# Patient Record
Sex: Female | Born: 1960
Health system: Southern US, Community
[De-identification: ages and names within clinical notes are randomized; demographics above are authoritative.]

## PROBLEM LIST (undated history)

## (undated) DIAGNOSIS — I10 Essential (primary) hypertension: Secondary | ICD-10-CM

## (undated) DIAGNOSIS — T7840XA Allergy, unspecified, initial encounter: Secondary | ICD-10-CM

## (undated) DIAGNOSIS — K635 Polyp of colon: Secondary | ICD-10-CM

## (undated) DIAGNOSIS — E7800 Pure hypercholesterolemia, unspecified: Secondary | ICD-10-CM

## (undated) DIAGNOSIS — M810 Age-related osteoporosis without current pathological fracture: Secondary | ICD-10-CM

## (undated) DIAGNOSIS — Z8619 Personal history of other infectious and parasitic diseases: Secondary | ICD-10-CM

## (undated) DIAGNOSIS — K219 Gastro-esophageal reflux disease without esophagitis: Secondary | ICD-10-CM

## (undated) HISTORY — DX: Personal history of other infectious and parasitic diseases: Z86.19

## (undated) HISTORY — DX: Age-related osteoporosis without current pathological fracture: M81.0

## (undated) HISTORY — DX: Allergy, unspecified, initial encounter: T78.40XA

## (undated) HISTORY — PX: TONSILLECTOMY: SUR1361

## (undated) HISTORY — DX: Essential (primary) hypertension: I10

## (undated) HISTORY — DX: Gastro-esophageal reflux disease without esophagitis: K21.9

## (undated) HISTORY — DX: Pure hypercholesterolemia, unspecified: E78.00

## (undated) HISTORY — PX: COLONOSCOPY: SHX174

## (undated) HISTORY — DX: Polyp of colon: K63.5

---

## 1998-09-16 ENCOUNTER — Other Ambulatory Visit: Admission: RE | Admit: 1998-09-16 | Discharge: 1998-09-16 | Payer: Self-pay | Admitting: Obstetrics and Gynecology

## 1999-11-14 ENCOUNTER — Other Ambulatory Visit: Admission: RE | Admit: 1999-11-14 | Discharge: 1999-11-14 | Payer: Self-pay | Admitting: Obstetrics and Gynecology

## 2000-02-21 ENCOUNTER — Encounter: Payer: Self-pay | Admitting: *Deleted

## 2000-02-21 ENCOUNTER — Ambulatory Visit (HOSPITAL_COMMUNITY): Admission: RE | Admit: 2000-02-21 | Discharge: 2000-02-21 | Payer: Self-pay | Admitting: *Deleted

## 2000-05-01 ENCOUNTER — Encounter: Admission: RE | Admit: 2000-05-01 | Discharge: 2000-07-30 | Payer: Self-pay | Admitting: Anesthesiology

## 2000-11-09 ENCOUNTER — Encounter: Admission: RE | Admit: 2000-11-09 | Discharge: 2001-02-07 | Payer: Self-pay | Admitting: Anesthesiology

## 2002-04-17 ENCOUNTER — Other Ambulatory Visit: Admission: RE | Admit: 2002-04-17 | Discharge: 2002-04-17 | Payer: Self-pay | Admitting: Obstetrics and Gynecology

## 2003-05-06 ENCOUNTER — Other Ambulatory Visit: Admission: RE | Admit: 2003-05-06 | Discharge: 2003-05-06 | Payer: Self-pay | Admitting: Obstetrics and Gynecology

## 2004-07-26 ENCOUNTER — Other Ambulatory Visit: Admission: RE | Admit: 2004-07-26 | Discharge: 2004-07-26 | Payer: Self-pay | Admitting: Obstetrics and Gynecology

## 2005-09-05 ENCOUNTER — Other Ambulatory Visit: Admission: RE | Admit: 2005-09-05 | Discharge: 2005-09-05 | Payer: Self-pay | Admitting: Obstetrics and Gynecology

## 2010-11-25 NOTE — H&P (Signed)
[facility]  Patient:    Marissa Gross, Marissa Gross                       MRN: [id number] Adm. Date:  [id number] Attending:  Thyra Breed CC:         Lilyan Punt. Sydnee Levans, M.D.   History and Physical  FOLLOWUP EVALUATION  HISTORY OF PRESENT ILLNESS:  Marissa Gross comes in for follow-up evaluation of her chronic low back pain syndrome on the basis of degenerative disc disease. Since her last evaluation, the patient has done remarkably well increasing her level of activity up to one to two hours four to five times per week.  She did note when she went to weightlifting she had more problems with her back, but she has reduced this back and is having less problems.  She has minimal complaints today.  She has no new neurologic symptoms.  She is planning to get into Pilates in the very near future.  CURRENT MEDICATIONS:  None.  PHYSICAL EXAMINATION:  VITAL SIGNS:  Blood pressure 115/54, heart rate 50, respiratory rate 16, and O2 saturation 100%.  Pain level 0/10.  NEUROLOGIC:  The patient demonstrated symmetric deep tendon reflexes with negative straight leg raise signs.  Motor was 5/5.  The patients pain was minimally exacerbated by hyperextension to 20 degrees and forward flexion to 45 degrees.  IMPRESSION:  Low back pain syndrome which seems to be fairly stable on current reconditioning management.  DISPOSITION: 1. Continue with current level of exercise and try to increase this with    Pilates as tolerated. 2. Followup with me in about year if needed. DD:  11/13/00 TD:  11/13/00 Job: [id number]

## 2010-11-25 NOTE — H&P (Signed)
[facility]  Patient:    Marissa Gross                       MRN: [id number] Proc. Date: 05/11/00 Adm. Date:  [id number] Attending:  Thyra Breed CC:         Lilyan Punt. Sydnee Levans, M.D.   History and Physical  HISTORY OF PRESENT ILLNESS:  Marissa Gross is a very pleasant 50 year old who is sent to [place] by Dr. Caryn Bee C. [place] for evaluation of her low back pain on the basis of degenerative disk disease.  The patient said that she was in her usual state of health up until July, when she was working out and took a fall on her knees and developed increasing lower back discomfort.  It took about two weeks for this to increase in severity.  She was seen by Dr. Tonny Branch and treated with nonsteroidal anti-inflammatory agents and physical therapy.  The medications were of no benefit but the physical therapy did help.  She underwent an MRI of her back which showed mild circumferential disk bulge at 4-5 and slight disk bulge at 5-S1.  She was sent to Dr. Julio Sicks, who did not feel she has a surgically amenable back.  She has continued with physical therapy and is going into Pilades-type exercises which sound like they may be helpful.  She is trying to stress more stretching exercises.  She describes her discomfort as a dull ache in her lower back which is made worse by bending and improved by lying down. There is no numbness or tingling, weakness or bowel or bladder incontinence. She feels as though her muscles are constantly strained.  CURRENT MEDICATIONS:  None.  ALLERGIES:  None.  FAMILY HISTORY:  Family history is positive for insulin-dependent diabetes in her brother, hypothyroidism, hypercholesterolemia and osteoarthritis.  PAST SURGICAL HISTORY:  Significant for a tonsillectomy.  SOCIAL HISTORY:  The patient is a nonsmoker.  She rarely drinks alcohol.  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  ACTIVE MEDICAL  PROBLEMS:  None.  REVIEW OF SYSTEMS:  Negative for general, head, eyes, nose, mouth, throat, ears, pulmonary, cardiovascular, GI, GU, neurologic, hematologic, endocrine, psychiatric.  Allergies positive for dry skin and positive family history of dry skin, as well as her musculoskeletal problems; she also has had some knee discomfort.  EXAMINATION  VITAL SIGNS:  Blood pressure 110/54, heart rate is 55, respiratory rate is 10, O2 is 100%, pain level is 2/10 and temperature is 97.7.  GENERAL:  This is a pleasant female in no acute distress.  HEENT:  Head was normocephalic, atraumatic.  Eyes:  Extraocular movements intact with conjunctivae and sclerae clear.  Nose:  Patent nares without discharge.  Oropharynx is free of lesions.  NECK:  Supple without lymphadenopathy.  Carotids are 2+ and symmetric without bruits.  LUNGS:  Clear.  HEART:  Regular rate and rhythm.  BREASTS:  Not performed.  ABDOMEN:  Not performed.  PELVIC:  Not performed.  RECTAL:  Not performed.  BACK:  Exam revealed mild increased pain on hyperextension, which was more brought out by palpation along the right side of the lumbar facets and was mild, even to palpation.  Forward flexion to about 60 degrees brought on some mild discomfort.  Straight leg raise signs are negative.  Gait is intact.  EXTREMITIES:  No cyanosis, clubbing nor edema, with radial pulses and dorsalis pedis pulses 2+ and symmetric.  NEUROLOGIC:  Patient is oriented x 4.  Cranial nerves II-XII are grossly intact.  Deep tendon reflexes were 2+ and symmetric in the upper and lower extremities with downgoing toes.  Motor was 5/5 with symmetric bulk and tone. Sensory was intact to scratch sense and light touch.  Coordination was grossly intact.  IMPRESSION:  Chronic low back pain on the basis of degenerative disk disease in an otherwise healthy female.  DISPOSITION 1. The patient was given some suggestions to read ______ book on the  back to    see whether this could be of some benefit. 2. She was encouraged to proceed with her exercise regime in a conservative    manner. 3. Trial of apple cider vinegar and honey to see whether this helps. 4. Follow up with me in six months to see whether there is any progression in    her problems.  She is encouraged to call if she is not responding to this    course of management. DD:  05/11/00 TD:  05/11/00 Job: [id number]

## 2011-08-07 LAB — HM COLONOSCOPY

## 2016-10-31 LAB — HM PAP SMEAR

## 2016-11-01 LAB — HM MAMMOGRAPHY

## 2017-07-10 HISTORY — PX: MOUTH SURGERY: SHX715

## 2017-12-27 LAB — HM PAP SMEAR

## 2017-12-31 LAB — HM MAMMOGRAPHY

## 2018-01-01 LAB — LIPID PANEL
Cholesterol: 206 — AB (ref 0–200)
HDL: 89 — AB (ref 35–70)
LDL Cholesterol: 96
Triglycerides: 111 (ref 40–160)

## 2018-01-04 LAB — VITAMIN D 25 HYDROXY (VIT D DEFICIENCY, FRACTURES): Vit D, 25-Hydroxy: 39

## 2018-01-04 LAB — TSH: TSH: 1.77 (ref <=5.90)

## 2018-01-04 LAB — HEMOGLOBIN A1C: Hemoglobin A1C: 5.1

## 2018-05-02 ENCOUNTER — Encounter: Payer: Self-pay | Admitting: Family Medicine

## 2018-05-02 ENCOUNTER — Ambulatory Visit (INDEPENDENT_AMBULATORY_CARE_PROVIDER_SITE_OTHER): Payer: Commercial Managed Care - PPO | Admitting: Family Medicine

## 2018-05-02 VITALS — BP 172/98 | HR 56 | Temp 97.6°F | Ht 64.0 in | Wt 119.2 lb

## 2018-05-02 DIAGNOSIS — Z0000 Encounter for general adult medical examination without abnormal findings: Secondary | ICD-10-CM | POA: Diagnosis not present

## 2018-05-02 DIAGNOSIS — R140 Abdominal distension (gaseous): Secondary | ICD-10-CM

## 2018-05-02 DIAGNOSIS — R030 Elevated blood-pressure reading, without diagnosis of hypertension: Secondary | ICD-10-CM | POA: Diagnosis not present

## 2018-05-02 DIAGNOSIS — H6123 Impacted cerumen, bilateral: Secondary | ICD-10-CM | POA: Diagnosis not present

## 2018-05-02 NOTE — Patient Instructions (Signed)
Debrox is over the counter ear wax medicine you can use  Once I get your records will let you know about labs.   Check Bp at home. Would come back in one month for recheck.. If still high we will need to start meds... :(

## 2018-05-02 NOTE — Progress Notes (Signed)
Patient: Marissa Gross MRN: [id number] DOB: 1961-01-05 PCP: Orland Mustard, MD     Subjective:  Chief Complaint  Patient presents with  . Establish Care    HPI: The patient is a 57 y.o. female who presents today for annual exam. She denies any changes to past medical history. There have been no recent hospitalizations. They are following a well balanced diet and exercise plan. Weight has been stable. Mom had breast cancer. She is up to date on her mmg.   She feels like she has gotten more gassy in the past year. Sometimes she will have pain, but not very often. She has intermittent bloating that comes and goes. Pain is more in her lower quadrants when it comes and doesn't last that long. Sometimes relieved with a BM and sometimes it goes away. There is no consistency and she thinks more due to a certain type of food. No diarrhea/constipation. No blood in stool. She still has her uterus and ovaries. No otc medications have been tried.   She also thinks she has cerumen in her ears that needs cleaned out.   There is no immunization history on file for this patient.   Colonoscopy: has had this and is past due.  Mammogram: June 2019. Normal  Pap smear: June 2019 Tdap: today  Flu shot: already done this year     Review of Systems  Constitutional: Positive for fatigue. Negative for chills and fever.  HENT: Negative for dental problem, ear pain, hearing loss and trouble swallowing.   Eyes: Negative for visual disturbance.  Respiratory: Negative for cough, chest tightness and shortness of breath.   Cardiovascular: Negative for chest pain, palpitations and leg swelling.  Gastrointestinal: Positive for abdominal pain. Negative for blood in stool, constipation, diarrhea and nausea.       Pt w/lower centralized abdominal pain occasionally.  More gassy than usual recently  Endocrine: Negative for cold intolerance, polydipsia, polyphagia and polyuria.  Genitourinary: Negative for dysuria and  hematuria.  Musculoskeletal: Negative for arthralgias, back pain and neck pain.  Skin: Negative for rash.  Neurological: Negative for dizziness and headaches.  Psychiatric/Behavioral: Positive for sleep disturbance. Negative for dysphoric mood. The patient is not nervous/anxious.     Allergies Patient has No Known Allergies.  Past Medical History Patient  has a past medical history of Allergy, Colon polyps, GERD (gastroesophageal reflux disease), and History of chicken pox.  Surgical History Patient  has a past surgical history that includes Tonsillectomy.  Family History Pateint's family history includes Arthritis in her father and mother; Birth defects in her brother; Cancer in her father and mother; Diabetes in her brother; Heart attack in her brother; Hyperlipidemia in her brother, father, and mother; Hypertension in her father.  Social History Patient  reports that she has quit smoking. Her smoking use included cigarettes. She has never used smokeless tobacco. She reports that she drinks alcohol. She reports that she does not use drugs.    Objective: Vitals:   05/02/18 1000 05/02/18 1059 05/02/18 1420  BP: (!) 142/80 (!) 180/100 (!) 172/98  Pulse: (!) 56    Temp: 97.6 F (36.4 C)    TempSrc: Oral    SpO2: 100%    Weight: 119 lb 3.2 oz (54.1 kg)    Height: 5\' 4"  (1.626 m)      Body mass index is 20.46 kg/m.  Physical Exam  Constitutional: She is oriented to person, place, and time. She appears well-developed and well-nourished.  HENT:  Right Ear:  External ear normal.  Left Ear: External ear normal.  Mouth/Throat: Oropharynx is clear and moist.  Bilateral TM obscured by impacted cerumen. After ears washed out, TM visualized and normal bilaterally   Eyes: Pupils are equal, round, and reactive to light. Conjunctivae and EOM are normal.  Neck: Normal range of motion. Neck supple. No thyromegaly present.  Cardiovascular: Normal rate, regular rhythm, normal heart sounds  and intact distal pulses.  No murmur heard. Pulmonary/Chest: Effort normal and breath sounds normal.  Abdominal: Soft. Bowel sounds are normal. She exhibits no distension. There is no tenderness.  Lymphadenopathy:    She has no cervical adenopathy.  Neurological: She is alert and oriented to person, place, and time. She displays normal reflexes. No cranial nerve deficit. Coordination normal.  Skin: Skin is warm and dry. No rash noted.  Psychiatric: She has a normal mood and affect. Her behavior is normal.  Vitals reviewed.    Ceruminosis is noted.  Wax is removed by syringing and manual debridement. Instructions for home care to prevent wax buildup are given.   Depression screen PHQ 2/9 05/02/2018  Decreased Interest 0  Down, Depressed, Hopeless 0  PHQ - 2 Score 0       Assessment/plan: 1. Annual physical exam utd on most of her health maintenance. Needs tdap and left before I could give this. Will give at follow up. Requesting all of her records before we do labs as she states her gyn ran a lot of tests this summer. Will still need hep c screen if we dray labs. She is a runner and eats healthy. Overall doing great. Also due for cscope which she will call and set up.  Patient counseling [x]    Nutrition: Stressed importance of moderation in sodium/caffeine intake, saturated fat and cholesterol, caloric balance, sufficient intake of fresh fruits, vegetables, fiber, calcium, iron, and 1 mg of folate supplement per day (for females capable of pregnancy).  [x]    Stressed the importance of regular exercise.   []    Substance Abuse: Discussed cessation/primary prevention of tobacco, alcohol, or other drug use; driving or other dangerous activities under the influence; availability of treatment for abuse.   [x]    Injury prevention: Discussed safety belts, safety helmets, smoke detector, smoking near bedding or upholstery.   [x]    Sexuality: Discussed sexually transmitted diseases, partner  selection, use of condoms, avoidance of unintended pregnancy  and contraceptive alternatives.  [x]    Dental health: Discussed importance of regular tooth brushing, flossing, and dental visits.  [x]    Health maintenance and immunizations reviewed. Please refer to Health maintenance section.      2. Elevated blood-pressure reading without diagnosis of hypertension Multiple elevated readings. She has a cuff at home and will start a log for me. Will come back in one month for follow up and recheck. Asked her to bring in her cuff. If still elevated, which I suspect it will be, will need to start medication. Discussed do not want her running this high and risks of uncontrolled HTN.    3. Bloating -intermittent and resolves and main complaint is gas. Likely associated with foods. FODMOP diet given. She will try to see if she can pinpoint what food could be contributing. Discussed if worsening symptoms or persistent bloating would work up ovarian issues, but at this point it resolves and is intermittent. She will try diet changes and let me know.    Return in about 1 month (around 06/02/2018) for bp check .   Orland Mustard,  MD [place] [facility]  05/02/2018

## 2018-06-10 ENCOUNTER — Encounter: Payer: Self-pay | Admitting: Family Medicine

## 2018-06-10 ENCOUNTER — Ambulatory Visit (INDEPENDENT_AMBULATORY_CARE_PROVIDER_SITE_OTHER): Payer: Commercial Managed Care - PPO | Admitting: Family Medicine

## 2018-06-10 VITALS — BP 140/64 | HR 46 | Temp 98.0°F | Ht 64.0 in | Wt 118.0 lb

## 2018-06-10 DIAGNOSIS — R030 Elevated blood-pressure reading, without diagnosis of hypertension: Secondary | ICD-10-CM

## 2018-06-10 DIAGNOSIS — Z23 Encounter for immunization: Secondary | ICD-10-CM

## 2018-06-10 NOTE — Progress Notes (Signed)
Patient: Marissa HeinzLori H Wittler MRN: [id number] DOB: Apr 29, 1961 PCP: Orland MustardWolfe, Julisa Flippo, MD     Subjective:  Chief Complaint  Patient presents with  . Hypertension    follow up    HPI: The patient is a 57 y.o. female who presents today for follow up of elevated blood pressure without diagnosis of HTN. I saw her a month ago and she had multiple elevated readings that were quite high and she was extremely surprised at this finding. We gave her a month to keep a home log of her readings and bring in her cuff today to make sure calibrated correctly. Home logs show her blood pressure to be all to goal. Ranges from 101-130/60-70. She did not bring in her blood pressure cuff today as she forgot. Denies any headaches, shortness of breath, chest pain or vision changes. Overall feels great.   Review of Systems  Constitutional: Negative for fatigue.  Respiratory: Negative for shortness of breath.   Cardiovascular: Negative for chest pain.  Gastrointestinal: Negative for abdominal pain, constipation, diarrhea and nausea.  Neurological: Negative for dizziness and headaches.  Psychiatric/Behavioral: Negative for sleep disturbance.    Allergies Patient has No Known Allergies.  Past Medical History Patient  has a past medical history of Allergy, Colon polyps, GERD (gastroesophageal reflux disease), and History of chicken pox.  Surgical History Patient  has a past surgical history that includes Tonsillectomy.  Family History Pateint's family history includes Arthritis in her father and mother; Birth defects in her brother; Cancer in her father and mother; Diabetes in her brother; Heart attack in her brother; Hyperlipidemia in her brother, father, and mother; Hypertension in her father.  Social History Patient  reports that she has quit smoking. Her smoking use included cigarettes. She has never used smokeless tobacco. She reports that she drinks alcohol. She reports that she does not use drugs.     Objective: Vitals:   06/10/18 0827 06/10/18 0839  BP: 122/82 140/64  Pulse: (!) 46   Temp: 98 F (36.7 C)   TempSrc: Oral   SpO2: 99%   Weight: 118 lb (53.5 kg)   Height: 5\' 4"  (1.626 m)     Body mass index is 20.25 kg/m.  Physical Exam  Constitutional: She appears well-developed and well-nourished.  Neck: Normal range of motion. Neck supple.  Cardiovascular: Normal rate, regular rhythm and normal heart sounds.  Pulmonary/Chest: Effort normal and breath sounds normal.  Abdominal: Soft. Bowel sounds are normal.  Psychiatric: She has a normal mood and affect. Her behavior is normal.  Vitals reviewed.      Assessment/plan: 1. Elevated blood-pressure reading without diagnosis of hypertension Home readings all to goal. Today much improved. When I repeated, her systolic was still elevated. She will continue home log and [facility] at the [facility] as well. Discussed goal of 130/80 or less. Will let me know if starts to run above this consistently. F/u for normal visit or as needed for blood pressure.   2. Need for Tdap vaccination  - Tdap vaccine greater than or equal to 7yo IM    Return if symptoms worsen or fail to improve.     Orland MustardAllison Baylor Cortez, MD [place] [facility]  06/10/2018

## 2018-09-09 LAB — HM COLONOSCOPY

## 2019-01-27 HISTORY — PX: COLONOSCOPY: SHX174

## 2019-01-27 LAB — HM COLONOSCOPY

## 2020-02-27 ENCOUNTER — Other Ambulatory Visit: Payer: Self-pay

## 2020-02-27 ENCOUNTER — Encounter: Payer: Self-pay | Admitting: Family Medicine

## 2020-02-27 ENCOUNTER — Ambulatory Visit (INDEPENDENT_AMBULATORY_CARE_PROVIDER_SITE_OTHER): Payer: Commercial Managed Care - PPO | Admitting: Family Medicine

## 2020-02-27 VITALS — BP 181/74 | HR 74 | Temp 98.4°F | Ht 64.0 in | Wt 120.4 lb

## 2020-02-27 DIAGNOSIS — R0602 Shortness of breath: Secondary | ICD-10-CM

## 2020-02-27 DIAGNOSIS — I10 Essential (primary) hypertension: Secondary | ICD-10-CM

## 2020-02-27 MED ORDER — LISINOPRIL 10 MG PO TABS: 10.0000 mg | ORAL_TABLET | Freq: Every day | ORAL | 3 refills | Status: DC

## 2020-02-27 NOTE — Progress Notes (Signed)
Patient: Marissa Gross MRN: [id number] DOB: 08/13/1960 PCP: Orland Mustard, MD     Subjective:  Chief Complaint  Patient presents with  . Hypertension  . Headache    HPI: The patient is a 59 y.o. female who presents today for elevated blood pressures. She hasn't been feeling good. She says that she hasn't been sleeping well. And short of breath on and off, she notices during her workouts. More noticeable now with masks wearing. She has headaches more often, that are very dull. She was seen in 04/2018 with a very elevated pressure. When she returned she had a more normal readings and was told to keep an eye on it, but has not followed up with it. She has had a lot going on for the past 2 weeks. Her mom passed away and she has moved twice. She doesn't think her home cuff is working correctly as it will be a normal reading than a high reading. She denies any palpitations/sweating. Thinks she may have shortness of breath when blood pressure is elevated. Feels short of breath with exercise. No cough/leg swelling. No vision changes.    Review of Systems  Constitutional: Negative for chills and fever.  Respiratory: Positive for shortness of breath. Negative for chest tightness and wheezing.   Cardiovascular: Negative for chest pain, palpitations and leg swelling.  Gastrointestinal: Negative for abdominal pain, nausea and vomiting.  Genitourinary: Negative for frequency.  Neurological: Negative for dizziness and headaches.    Allergies Patient has No Known Allergies.  Past Medical History Patient  has a past medical history of Allergy, Colon polyps, GERD (gastroesophageal reflux disease), and History of chicken pox.  Surgical History Patient  has a past surgical history that includes Tonsillectomy.  Family History Pateint's family history includes Arthritis in her father and mother; Birth defects in her brother; Cancer in her father and mother; Diabetes in her brother; Heart attack in  her brother; Hyperlipidemia in her brother, father, and mother; Hypertension in her father.  Social History Patient  reports that she has quit smoking. Her smoking use included cigarettes. She has never used smokeless tobacco. She reports current alcohol use. She reports that she does not use drugs.    Objective: Vitals:   02/27/20 1435  BP: (!) 181/74  Pulse: 74  Temp: 98.4 F (36.9 C)  TempSrc: Temporal  SpO2: 99%  Weight: 120 lb 6.4 oz (54.6 kg)  Height: 5\' 4"  (1.626 m)    Body mass index is 20.67 kg/m.  Physical Exam Vitals reviewed.  Constitutional:      Appearance: She is well-developed and normal weight.  HENT:     Head: Normocephalic and atraumatic.  Cardiovascular:     Rate and Rhythm: Regular rhythm. Bradycardia present.     Heart sounds: Normal heart sounds.  Pulmonary:     Effort: Pulmonary effort is normal.     Breath sounds: Normal breath sounds.  Abdominal:     General: Bowel sounds are normal.     Palpations: Abdomen is soft.  Musculoskeletal:     Cervical back: Normal range of motion and neck supple.  Skin:    General: Skin is warm and dry.  Neurological:     Mental Status: She is alert.  Psychiatric:        Mood and Affect: Mood normal.    ekg with LVH. Rate of 54. No st wave elevation. Some artifact.     Assessment/plan: 1. Essential hypertension Above goal x 2 readings and in the past.  We are going to start low dose lisinopril 10mg /day  and have her keep a log for me. After 2 weeks if still above 140/80 she is to increase medication to 20mg /day. Side effects of ACE-I discussed including dry cough and angioedema. She is to call me if feels like they have a dry cough and they are to call 911 or go to ER if any signs/symptoms of angioedema.  Since having periodic episodes of high blood pressure will check 24 hour urine metanephrines and catecholamines to rule out pheo. Her father does have HTN. Precautions given for ER if chest pain, worsening sob or  vision changes/headache. F/u with me in one month.   - EKG 12-Lead - Metanephrines, Urine, 24 hour; Future - Catecholamines, fractionated, Urine, 24 hour; Future - CBC with Differential/Platelet; Future - Comprehensive metabolic panel; Future - TSH; Future - Microalbumin / creatinine urine ratio; Future - Brain natriuretic peptide; Future - Microalbumin / creatinine urine ratio - TSH - Comprehensive metabolic panel - CBC with Differential/Platelet - Brain natriuretic peptide  2. Shortness of breath Exam wnl. No cough/swelling/orthopnea. Checking labs/BNP. If not better in 2 weeks we will xray.    This visit occurred during the SARS-CoV-2 public health emergency.  Safety protocols were in place, including screening questions prior to the visit, additional usage of staff PPE, and extensive cleaning of exam room while observing appropriate contact time as indicated for disinfecting solutions.     Return in about 1 month (around 03/29/2020) for blood pressure .   , MD [place] [facility]   02/27/2020

## 2020-02-27 NOTE — Patient Instructions (Signed)
-  ekg shows possible long standing HTN and strain on the heart -going to do urine labs to rule out pheochromocytoma. (can cause episodic spikes in blood pressure)  -starting lisinopril. Side effects of ACE-I discussed including dry cough and angioedema. Will start low dose at 10mg /day. If after 2 weeks you are still elevated increase to 20mg /day. Goal is <140/80. Medication takes about 2 weeks.   -if still feeling short of breath in 2 weeks email me so I can order CXR or if gets worse before this let me know.   -see you back in one month!  Dr. 

## 2020-02-28 LAB — CBC WITH DIFFERENTIAL/PLATELET
Absolute Monocytes: 413 cells/uL (ref 200–950)
Basophils Absolute: 39 cells/uL (ref 0–200)
Basophils Relative: 0.9 %
Eosinophils Absolute: 112 cells/uL (ref 15–500)
Eosinophils Relative: 2.6 %
HCT: 37.7 % (ref 35.0–45.0)
Hemoglobin: 13 g/dL (ref 11.7–15.5)
Lymphs Abs: 1445 cells/uL (ref 850–3900)
MCH: 30.6 pg (ref 27.0–33.0)
MCHC: 34.5 g/dL (ref 32.0–36.0)
MCV: 88.7 fL (ref 80.0–100.0)
MPV: 10.1 fL (ref 7.5–12.5)
Monocytes Relative: 9.6 %
Neutro Abs: 2292 cells/uL (ref 1500–7800)
Neutrophils Relative %: 53.3 %
Platelets: 256 10*3/uL (ref 140–400)
RBC: 4.25 10*6/uL (ref 3.80–5.10)
RDW: 12.2 % (ref 11.0–15.0)
Total Lymphocyte: 33.6 %
WBC: 4.3 10*3/uL (ref 3.8–10.8)

## 2020-02-28 LAB — COMPREHENSIVE METABOLIC PANEL
AG Ratio: 2.1 (calc) (ref 1.0–2.5)
ALT: 12 U/L (ref 6–29)
AST: 16 U/L (ref 10–35)
Albumin: 4.4 g/dL (ref 3.6–5.1)
Alkaline phosphatase (APISO): 67 U/L (ref 37–153)
BUN: 11 mg/dL (ref 7–25)
CO2: 29 mmol/L (ref 20–32)
Calcium: 10 mg/dL (ref 8.6–10.4)
Chloride: 104 mmol/L (ref 98–110)
Creat: 0.87 mg/dL (ref 0.50–1.05)
Globulin: 2.1 g/dL (calc) (ref 1.9–3.7)
Glucose, Bld: 98 mg/dL (ref 65–99)
Potassium: 4.2 mmol/L (ref 3.5–5.3)
Sodium: 140 mmol/L (ref 135–146)
Total Bilirubin: 0.6 mg/dL (ref 0.2–1.2)
Total Protein: 6.5 g/dL (ref 6.1–8.1)

## 2020-02-28 LAB — BRAIN NATRIURETIC PEPTIDE: Brain Natriuretic Peptide: 47 pg/mL (ref <100)

## 2020-02-28 LAB — MICROALBUMIN / CREATININE URINE RATIO
Creatinine, Urine: 19 mg/dL — ABNORMAL LOW (ref 20–275)
Microalb Creat Ratio: 11 mcg/mg creat (ref <30)
Microalb, Ur: 0.2 mg/dL

## 2020-02-28 LAB — TSH: TSH: 1.18 mIU/L (ref 0.40–4.50)

## 2020-03-01 ENCOUNTER — Other Ambulatory Visit: Payer: Self-pay

## 2020-03-01 DIAGNOSIS — I10 Essential (primary) hypertension: Secondary | ICD-10-CM

## 2020-03-04 LAB — METANEPHRINES, URINE, 24 HOUR
Metaneph Total, Ur: 277 mcg/24 h (ref 224–832)
Metanephrines, Ur: 81 mcg/24 h — ABNORMAL LOW (ref 90–315)
Normetanephrine, 24H Ur: 196 mcg/24 h (ref 122–676)
Volume, Urine-VMAUR: 3100 mL

## 2020-03-04 LAB — CATECHOLAMINES, FRACTIONATED, URINE, 24 HOUR
Calc Total (E+NE): 37 mcg/24 h (ref 26–121)
Creatinine, Urine mg/day-CATEUR: 0.87 g/(24.h) (ref 0.50–2.15)
Dopamine 24 Hr Urine: 135 mcg/24 h (ref 52–480)
Norepinephrine, 24H, Ur: 37 mcg/24 h (ref 15–100)
Total Volume: 3100 mL

## 2020-03-29 ENCOUNTER — Ambulatory Visit (INDEPENDENT_AMBULATORY_CARE_PROVIDER_SITE_OTHER): Payer: Commercial Managed Care - PPO | Admitting: Family Medicine

## 2020-03-29 ENCOUNTER — Encounter: Payer: Self-pay | Admitting: Family Medicine

## 2020-03-29 ENCOUNTER — Other Ambulatory Visit: Payer: Self-pay

## 2020-03-29 VITALS — BP 128/78 | HR 69 | Wt 120.0 lb

## 2020-03-29 DIAGNOSIS — I10 Essential (primary) hypertension: Secondary | ICD-10-CM | POA: Diagnosis not present

## 2020-03-29 MED ORDER — LISINOPRIL 20 MG PO TABS: 20.0000 mg | ORAL_TABLET | Freq: Every day | ORAL | 3 refills | Status: DC

## 2020-03-29 NOTE — Progress Notes (Signed)
Patient: Marissa Gross MRN: [id number] DOB: 04/28/1961 PCP: Orland Mustard, MD     Subjective:  Chief Complaint  Patient presents with  . Hypertension    HPI: The patient is a 59 y.o. female who presents today for blood pressure follow up.   Hypertension: Here for follow up of hypertension.  Currently on lisinopril 10mg  . Home readings range from 135-140 systolic/60 diastolic. Takes medication as prescribed and denies any side effects. Exercise includes peloton.  Weight has been stable. Denies any chest pain, headaches, shortness of breath, vision changes, swelling in lower extremities. She states she feels better when her blood pressure is around 120/80. When she first started the lisinopril is was closer to these numbers, but then went up.    Review of Systems  Constitutional: Negative for chills and fever.  Respiratory: Negative for cough and shortness of breath.   Cardiovascular: Negative for chest pain, palpitations and leg swelling.  Gastrointestinal: Negative for abdominal pain, diarrhea, nausea and vomiting.  Neurological: Negative for headaches.  Psychiatric/Behavioral: Negative for suicidal ideas.    Allergies Patient has No Known Allergies.  Past Medical History Patient  has a past medical history of Allergy, Colon polyps, GERD (gastroesophageal reflux disease), and History of chicken pox.  Surgical History Patient  has a past surgical history that includes Tonsillectomy.  Family History Pateint's family history includes Arthritis in her father and mother; Birth defects in her brother; Cancer in her father and mother; Diabetes in her brother; Heart attack in her brother; Hyperlipidemia in her brother, father, and mother; Hypertension in her father.  Social History Patient  reports that she has quit smoking. Her smoking use included cigarettes. She has never used smokeless tobacco. She reports current alcohol use. She reports that she does not use drugs.     Objective: Vitals:   03/29/20 0838  BP: 128/78  Pulse: 69  SpO2: 98%  Weight: 120 lb (54.4 kg)    Body mass index is 20.6 kg/m.  Physical Exam Vitals reviewed.  Constitutional:      Appearance: Normal appearance. She is normal weight.  HENT:     Head: Normocephalic and atraumatic.  Cardiovascular:     Rate and Rhythm: Normal rate and regular rhythm.     Heart sounds: Normal heart sounds.  Pulmonary:     Effort: Pulmonary effort is normal.     Breath sounds: Normal breath sounds.  Abdominal:     General: Abdomen is flat. Bowel sounds are normal.     Palpations: Abdomen is soft.  Neurological:     General: No focal deficit present.     Mental Status: She is alert and oriented to person, place, and time.  Psychiatric:        Mood and Affect: Mood normal.        Behavior: Behavior normal.        Assessment/plan: 1. Essential hypertension A little above goal at home and in office. We are going to increase her lisinopril to 20mg /day. If still not to goal around 2-3 weeks I want her to increase to 30mg /day. Goal is less than 130/80. She also feels much better when it's around 120/80. Will see her back in 3 months or sooner if any issues. Continue healthy diet and exercise.     This visit occurred during the SARS-CoV-2 public health emergency.  Safety protocols were in place, including screening questions prior to the visit, additional usage of staff PPE, and extensive cleaning of exam room while observing appropriate  contact time as indicated for disinfecting solutions.     Return in about 3 months (around 06/28/2020) for htn.     Orland Mustard, MD [facility]  03/29/2020

## 2020-03-29 NOTE — Patient Instructions (Signed)
Increasing your lisinopril to 20mg /day. I want you less than 130/80. If you are not there after one month, call me!   -otherwise i'll see you back in 3 months then if to goal you can go back to yearly. :)

## 2020-06-25 ENCOUNTER — Ambulatory Visit: Payer: Commercial Managed Care - PPO | Admitting: Family Medicine

## 2020-07-22 ENCOUNTER — Encounter: Payer: Self-pay | Admitting: Family Medicine

## 2020-07-22 ENCOUNTER — Other Ambulatory Visit: Payer: Self-pay

## 2020-07-22 ENCOUNTER — Ambulatory Visit (INDEPENDENT_AMBULATORY_CARE_PROVIDER_SITE_OTHER): Payer: Commercial Managed Care - PPO | Admitting: Family Medicine

## 2020-07-22 VITALS — BP 128/82 | HR 59 | Temp 97.7°F | Ht 64.0 in | Wt 118.4 lb

## 2020-07-22 DIAGNOSIS — I10 Essential (primary) hypertension: Secondary | ICD-10-CM | POA: Diagnosis not present

## 2020-07-22 MED ORDER — HYDROCHLOROTHIAZIDE 25 MG PO TABS: 25.0000 mg | ORAL_TABLET | Freq: Every day | ORAL | 3 refills | Status: DC

## 2020-07-22 NOTE — Patient Instructions (Signed)
STOP lisinopril! I put as an allergy due to the cough.  We are going to start you on a blood pressure pill called hydrochlorothiazide. It's a diuretic, can make you urinate more so take it in the AM. If any issues let me know.   Start with 1/2 pill daily and then increase to 1 full pill/day.   Let's recheck labs and blood pressure in 1-3 months.    Let me know if any issues with meds.   Happy new year! Aw

## 2020-07-22 NOTE — Progress Notes (Signed)
Patient: Marissa Gross MRN: [id number] DOB: 01-21-1961 PCP: Orland Mustard, MD     Subjective:  Chief Complaint  Patient presents with  . Hypertension    HPI: The patient is a 60 y.o. female who presents today for HTN.  Hypertension: Here for follow up of hypertension.  Currently on lisinopril 20mg  . Home readings range from 130 systolic/60-65 diastolic. Takes medication as prescribed, but has started to get a dry cough and feels like her muscles are really sore after she has worked out or even they just do not feel normal.  Exercise includes walking. Weight has been stable. Denies any chest pain, headaches, shortness of breath, vision changes, swelling in lower extremities.    Review of Systems  Constitutional: Negative for chills, fatigue and fever.  HENT: Negative for dental problem, ear pain, hearing loss and trouble swallowing.   Eyes: Negative for visual disturbance.  Respiratory: Negative for cough, chest tightness and shortness of breath.   Cardiovascular: Negative for chest pain, palpitations and leg swelling.  Gastrointestinal: Negative for abdominal pain, blood in stool, diarrhea and nausea.  Endocrine: Negative for cold intolerance, polydipsia, polyphagia and polyuria.  Genitourinary: Negative for dysuria and hematuria.  Musculoskeletal: Negative for arthralgias.  Skin: Negative for rash.  Neurological: Negative for dizziness and headaches.  Psychiatric/Behavioral: Negative for dysphoric mood and sleep disturbance. The patient is not nervous/anxious.     Allergies Patient is allergic to lisinopril.  Past Medical History Patient  has a past medical history of Allergy, Colon polyps, GERD (gastroesophageal reflux disease), and History of chicken pox.  Surgical History Patient  has a past surgical history that includes Tonsillectomy.  Family History Pateint's family history includes Arthritis in her father and mother; Birth defects in her brother; Cancer in her  father and mother; Diabetes in her brother; Heart attack in her brother; Hyperlipidemia in her brother, father, and mother; Hypertension in her father.  Social History Patient  reports that she has quit smoking. Her smoking use included cigarettes. She has never used smokeless tobacco. She reports current alcohol use. She reports that she does not use drugs.    Objective: Vitals:   07/22/20 0806  BP: 128/82  Pulse: (!) 59  Temp: 97.7 F (36.5 C)  TempSrc: Temporal  SpO2: 100%  Weight: 118 lb 6.4 oz (53.7 kg)  Height: 5\' 4"  (1.626 m)    Body mass index is 20.32 kg/m.  Physical Exam Vitals reviewed.  Constitutional:      Appearance: Normal appearance. She is normal weight.  HENT:     Head: Normocephalic and atraumatic.  Cardiovascular:     Rate and Rhythm: Normal rate and regular rhythm.     Heart sounds: Normal heart sounds.  Pulmonary:     Effort: Pulmonary effort is normal.     Breath sounds: Normal breath sounds.  Abdominal:     General: Abdomen is flat. Bowel sounds are normal.     Palpations: Abdomen is soft.  Skin:    Capillary Refill: Capillary refill takes less than 2 seconds.  Neurological:     General: No focal deficit present.     Mental Status: She is alert and oriented to person, place, and time.  Psychiatric:        Mood and Affect: Mood normal.        Behavior: Behavior normal.        Assessment/plan: 1. Essential hypertension We are going to stop her lisinopril since she has had a dry cough and adverse muscle  events. Discussed options and will do trial of hctz. Start out 1/2 pill x 1 week then increase to full 25mg  pill. Continue with her log. F/u in 1-3 months for labs/blood pressure check. Let me know if any issues with new medication.   This visit occurred during the SARS-CoV-2 public health emergency.  Safety protocols were in place, including screening questions prior to the visit, additional usage of staff PPE, and extensive cleaning of exam  room while observing appropriate contact time as indicated for disinfecting solutions.     Return in about 1 month (around 08/22/2020) for blood pressure/labs .    08/24/2020, MD [facility]   07/22/2020

## 2020-09-20 ENCOUNTER — Other Ambulatory Visit: Payer: Self-pay

## 2020-09-20 ENCOUNTER — Ambulatory Visit (INDEPENDENT_AMBULATORY_CARE_PROVIDER_SITE_OTHER): Payer: Commercial Managed Care - PPO | Admitting: Family Medicine

## 2020-09-20 ENCOUNTER — Encounter: Payer: Self-pay | Admitting: Family Medicine

## 2020-09-20 VITALS — BP 122/60 | HR 46 | Temp 97.6°F | Ht 64.0 in | Wt 120.6 lb

## 2020-09-20 DIAGNOSIS — Z1159 Encounter for screening for other viral diseases: Secondary | ICD-10-CM | POA: Diagnosis not present

## 2020-09-20 DIAGNOSIS — I10 Essential (primary) hypertension: Secondary | ICD-10-CM

## 2020-09-20 DIAGNOSIS — Z114 Encounter for screening for human immunodeficiency virus [HIV]: Secondary | ICD-10-CM | POA: Diagnosis not present

## 2020-09-20 LAB — BASIC METABOLIC PANEL
BUN: 8 mg/dL (ref 6–23)
CO2: 30 mEq/L (ref 19–32)
Calcium: 9.6 mg/dL (ref 8.4–10.5)
Chloride: 98 mEq/L (ref 96–112)
Creatinine, Ser: 0.7 mg/dL (ref 0.40–1.20)
GFR: 94.34 mL/min (ref >60.00)
Glucose, Bld: 95 mg/dL (ref 70–99)
Potassium: 4.1 mEq/L (ref 3.5–5.1)
Sodium: 136 mEq/L (ref 135–145)

## 2020-09-20 LAB — LIPID PANEL
Cholesterol: 233 mg/dL — ABNORMAL HIGH (ref 0–200)
HDL: 107.6 mg/dL (ref >39.00)
LDL Cholesterol: 100 mg/dL — ABNORMAL HIGH (ref 0–99)
NonHDL: 125.29
Total CHOL/HDL Ratio: 2
Triglycerides: 126 mg/dL (ref 0.0–149.0)
VLDL: 25.2 mg/dL (ref 0.0–40.0)

## 2020-09-20 NOTE — Progress Notes (Signed)
Patient: Marissa Gross MRN: [id number] DOB: 01/11/1961 PCP: Orland Mustard, MD     Subjective:  Chief Complaint  Patient presents with  . Hypertension    HPI: The patient is a 60 y.o. female who presents today for HTN.  Hypertension: Here for follow up of hypertension.  Currently on hctz 25mg /day. I saw her a month ago and she had a dry cough with ACEI and changed her to hctz.  She has had no issues with the full pill. She is here for follow up. Home readings range from 120-130 systolic/60-70 diastolic. Takes medication as prescribed and denies any side effects. Exercise includes walking. Weight has been stable. Denies any chest pain, headaches, shortness of breath, vision changes, swelling in lower extremities.   Also reviewed HM. UTD. She is fasting for cholesterol today as well.    Review of Systems  Constitutional: Negative for chills, fatigue and fever.  HENT: Negative for dental problem, ear pain, hearing loss and trouble swallowing.   Eyes: Negative for visual disturbance.  Respiratory: Negative for cough, chest tightness and shortness of breath.   Cardiovascular: Negative for chest pain, palpitations and leg swelling.  Gastrointestinal: Negative for abdominal pain, blood in stool, diarrhea and nausea.  Endocrine: Negative for cold intolerance, polydipsia, polyphagia and polyuria.  Genitourinary: Negative for dysuria and hematuria.  Musculoskeletal: Negative for arthralgias.  Skin: Negative for rash.  Neurological: Negative for dizziness and headaches.  Psychiatric/Behavioral: Negative for dysphoric mood and sleep disturbance. The patient is not nervous/anxious.     Allergies Patient is allergic to lisinopril.  Past Medical History Patient  has a past medical history of Allergy, Colon polyps, GERD (gastroesophageal reflux disease), and History of chicken pox.  Surgical History Patient  has a past surgical history that includes Tonsillectomy.  Family  History Pateint's family history includes Arthritis in her father and mother; Birth defects in her brother; Cancer in her father and mother; Diabetes in her brother; Heart attack in her brother; Hyperlipidemia in her brother, father, and mother; Hypertension in her father.  Social History Patient  reports that she has quit smoking. Her smoking use included cigarettes. She has never used smokeless tobacco. She reports current alcohol use. She reports that she does not use drugs.    Objective: Vitals:   09/20/20 0807  BP: 122/60  Pulse: (!) 46  Temp: 97.6 F (36.4 C)  TempSrc: Temporal  SpO2: 100%  Weight: 120 lb 9.6 oz (54.7 kg)  Height: 5\' 4"  (1.626 m)    Body mass index is 20.7 kg/m.  Physical Exam Vitals reviewed.  Constitutional:      Appearance: Normal appearance. She is well-developed and normal weight.  HENT:     Head: Normocephalic and atraumatic.     Right Ear: External ear normal.     Left Ear: External ear normal.  Eyes:     Conjunctiva/sclera: Conjunctivae normal.     Pupils: Pupils are equal, round, and reactive to light.  Neck:     Thyroid: No thyromegaly.  Cardiovascular:     Rate and Rhythm: Normal rate and regular rhythm.     Pulses: Normal pulses.     Heart sounds: Normal heart sounds. No murmur heard.   Pulmonary:     Effort: Pulmonary effort is normal.     Breath sounds: Normal breath sounds.  Abdominal:     General: Abdomen is flat. Bowel sounds are normal. There is no distension.     Palpations: Abdomen is soft.  Tenderness: There is no abdominal tenderness.  Musculoskeletal:     Cervical back: Normal range of motion and neck supple.  Lymphadenopathy:     Cervical: No cervical adenopathy.  Skin:    General: Skin is warm and dry.     Capillary Refill: Capillary refill takes less than 2 seconds.     Findings: No rash.  Neurological:     General: No focal deficit present.     Mental Status: She is alert and oriented to person, place, and  time.     Cranial Nerves: No cranial nerve deficit.     Coordination: Coordination normal.     Deep Tendon Reflexes: Reflexes normal.  Psychiatric:        Mood and Affect: Mood normal.        Behavior: Behavior normal.    Flowsheet Row Office Visit from 02/27/2020 in [facility]  PHQ-2 Total Score 0         Assessment/plan: 1. Essential hypertension Blood pressure is to goal. Continue current anti-hypertensive medication: hctz 25mg /day. Refills not given and routine lab work will be done today. Recommended routine exercise and healthy diet including DASH diet and mediterranean diet. Encouraged weight loss. F/u in 6 months for annual.    - Basic metabolic panel - Lipid panel  2. Encounter for hepatitis C screening test for low risk patient  - Hepatitis C antibody  3. Encounter for screening for HIV  - HIV Antibody (routine testing w rflx)    This visit occurred during the SARS-CoV-2 public health emergency.  Safety protocols were in place, including screening questions prior to the visit, additional usage of staff PPE, and extensive cleaning of exam room while observing appropriate contact time as indicated for disinfecting solutions.     Return in about 6 months (around 03/23/2021) for annual/bp .   03/25/2021, MD [facility]   09/20/2020

## 2020-09-20 NOTE — Patient Instructions (Signed)
-  routine chol. And potassium check today.   -f/u in august/september for annual!   Blood pressure is perfect!   You look great! Keep up the good work!   aw

## 2020-09-21 LAB — HIV ANTIBODY (ROUTINE TESTING W REFLEX): HIV 1&2 Ab, 4th Generation: NONREACTIVE

## 2020-09-21 LAB — HEPATITIS C ANTIBODY
Hepatitis C Ab: NONREACTIVE
SIGNAL TO CUT-OFF: 0 (ref <1.00)

## 2021-03-25 ENCOUNTER — Encounter: Payer: Commercial Managed Care - PPO | Admitting: Family Medicine

## 2021-04-29 ENCOUNTER — Encounter: Payer: Commercial Managed Care - PPO | Admitting: Physician Assistant

## 2021-06-21 ENCOUNTER — Other Ambulatory Visit: Payer: Self-pay

## 2021-06-21 ENCOUNTER — Encounter: Payer: Self-pay | Admitting: Physician Assistant

## 2021-06-21 ENCOUNTER — Ambulatory Visit (INDEPENDENT_AMBULATORY_CARE_PROVIDER_SITE_OTHER): Payer: Commercial Managed Care - PPO | Admitting: Physician Assistant

## 2021-06-21 VITALS — BP 112/62 | HR 48 | Temp 97.6°F | Ht 64.0 in | Wt 123.2 lb

## 2021-06-21 DIAGNOSIS — M810 Age-related osteoporosis without current pathological fracture: Secondary | ICD-10-CM | POA: Diagnosis not present

## 2021-06-21 DIAGNOSIS — I10 Essential (primary) hypertension: Secondary | ICD-10-CM | POA: Diagnosis not present

## 2021-06-21 LAB — COMPREHENSIVE METABOLIC PANEL
ALT: 12 U/L (ref 0–35)
AST: 17 U/L (ref 0–37)
Albumin: 4.4 g/dL (ref 3.5–5.2)
Alkaline Phosphatase: 59 U/L (ref 39–117)
BUN: 8 mg/dL (ref 6–23)
CO2: 28 mEq/L (ref 19–32)
Calcium: 10 mg/dL (ref 8.4–10.5)
Chloride: 102 mEq/L (ref 96–112)
Creatinine, Ser: 0.67 mg/dL (ref 0.40–1.20)
GFR: 94.84 mL/min (ref >60.00)
Glucose, Bld: 92 mg/dL (ref 70–99)
Potassium: 4.2 mEq/L (ref 3.5–5.1)
Sodium: 138 mEq/L (ref 135–145)
Total Bilirubin: 0.6 mg/dL (ref 0.2–1.2)
Total Protein: 6.9 g/dL (ref 6.0–8.3)

## 2021-06-21 LAB — CBC WITH DIFFERENTIAL/PLATELET
Basophils Absolute: 0 K/uL (ref 0.0–0.1)
Basophils Relative: 0.7 % (ref 0.0–3.0)
Eosinophils Absolute: 0 K/uL (ref 0.0–0.7)
Eosinophils Relative: 0.9 % (ref 0.0–5.0)
HCT: 38.6 % (ref 36.0–46.0)
Hemoglobin: 13.1 g/dL (ref 12.0–15.0)
Lymphocytes Relative: 38 % (ref 12.0–46.0)
Lymphs Abs: 1.6 K/uL (ref 0.7–4.0)
MCHC: 34 g/dL (ref 30.0–36.0)
MCV: 89.6 fl (ref 78.0–100.0)
Monocytes Absolute: 0.4 K/uL (ref 0.1–1.0)
Monocytes Relative: 8.8 % (ref 3.0–12.0)
Neutro Abs: 2.1 K/uL (ref 1.4–7.7)
Neutrophils Relative %: 51.6 % (ref 43.0–77.0)
Platelets: 253 K/uL (ref 150.0–400.0)
RBC: 4.31 Mil/uL (ref 3.87–5.11)
RDW: 12.9 % (ref 11.5–15.5)
WBC: 4.2 K/uL (ref 4.0–10.5)

## 2021-06-21 LAB — VITAMIN D 25 HYDROXY (VIT D DEFICIENCY, FRACTURES): VITD: 32.48 ng/mL (ref 30.00–100.00)

## 2021-06-21 MED ORDER — HYDROCHLOROTHIAZIDE 25 MG PO TABS: 25.0000 mg | ORAL_TABLET | Freq: Every day | ORAL | 3 refills | Status: DC

## 2021-06-21 NOTE — Progress Notes (Signed)
Subjective:    Patient ID: Marissa Gross, female    DOB: 02-07-1961, 60 y.o.   MRN: [id number]  Chief Complaint  Patient presents with   Establish Care    HPI 60 y.o. patient presents today for new patient establishment with me.  Patient was previously established with Dr. Artis Flock.  Current Care Team: Dr. Renaldo Fiddler, GYN   Acute Concerns: First DEXA scan done with GYN on 05/25/21. New diagnosis of osteoporosis:  AP Spine -1.7 Osteopenia Femoral neck (left) -2.5 Femoral neck (right) -2.3   Enjoys Peloton weight classes at home. Unsure about medications at this time or not. She is taking Vit D and calcium supplements.   Past Medical History:  Diagnosis Date   Allergy    Colon polyps    GERD (gastroesophageal reflux disease)    History of chicken pox     Past Surgical History:  Procedure Laterality Date   TONSILLECTOMY      Family History  Problem Relation Age of Onset   Arthritis Mother    Cancer Mother    Hyperlipidemia Mother    Arthritis Father    Cancer Father    Hypertension Father    Hyperlipidemia Father    Heart attack Brother    Birth defects Brother    Diabetes Brother    Hyperlipidemia Brother     Social History   Tobacco Use   Smoking status: Former    Types: Cigarettes   Smokeless tobacco: Never  Building services engineer Use: Never used  Substance Use Topics   Alcohol use: Yes   Drug use: Never     Allergies  Allergen Reactions   Lisinopril     Review of Systems NEGATIVE UNLESS OTHERWISE INDICATED IN HPI      Objective:     BP 112/62   Pulse (!) 48   Temp 97.6 F (36.4 C)   Ht 5\' 4"  (1.626 m)   Wt 123 lb 4 oz (55.9 kg)   LMP  (LMP Unknown)   SpO2 100%   BMI 21.16 kg/m   Wt Readings from Last 3 Encounters:  06/21/21 123 lb 4 oz (55.9 kg)  09/20/20 120 lb 9.6 oz (54.7 kg)  07/22/20 118 lb 6.4 oz (53.7 kg)    BP Readings from Last 3 Encounters:  06/21/21 112/62  09/20/20 122/60  07/22/20 128/82     Physical  Exam Vitals and nursing note reviewed.  Constitutional:      Appearance: Normal appearance. She is normal weight. She is not toxic-appearing.  HENT:     Head: Normocephalic and atraumatic.     Right Ear: External ear normal.     Left Ear: External ear normal.     Nose: Nose normal.     Mouth/Throat:     Mouth: Mucous membranes are moist.  Eyes:     Extraocular Movements: Extraocular movements intact.     Conjunctiva/sclera: Conjunctivae normal.     Pupils: Pupils are equal, round, and reactive to light.  Cardiovascular:     Rate and Rhythm: Regular rhythm. Bradycardia present.     Heart sounds: Normal heart sounds.  Pulmonary:     Effort: Pulmonary effort is normal.     Breath sounds: Normal breath sounds.  Musculoskeletal:        General: Normal range of motion.     Cervical back: Normal range of motion and neck supple.  Skin:    General: Skin is warm and dry.  Neurological:  General: No focal deficit present.     Mental Status: She is alert and oriented to person, place, and time.  Psychiatric:        Mood and Affect: Mood normal.        Behavior: Behavior normal.        Thought Content: Thought content normal.        Judgment: Judgment normal.       Assessment & Plan:   Problem List Items Addressed This Visit       Cardiovascular and Mediastinum   Essential hypertension - Primary   Relevant Medications   hydrochlorothiazide (HYDRODIURIL) 25 MG tablet   Other Relevant Orders   CBC with Differential/Platelet   Comprehensive metabolic panel   VITAMIN D 25 Hydroxy (Vit-D Deficiency, Fractures)     Musculoskeletal and Integument   Osteoporosis of femur without pathological fracture   Relevant Orders   CBC with Differential/Platelet   Comprehensive metabolic panel   VITAMIN D 25 Hydroxy (Vit-D Deficiency, Fractures)     Meds ordered this encounter  Medications   hydrochlorothiazide (HYDRODIURIL) 25 MG tablet    Sig: Take 1 tablet (25 mg total) by mouth  daily.    Dispense:  90 tablet    Refill:  3    1. Essential hypertension -Stable, to goal -Refilled HCTZ 25 mg daily -DASH diet, doing well with exercise  2. Osteoporosis of femur without pathological fracture -From her scores, I was able to calculate the following for her: FRAX Score Major Osteoporotic fracture risk in 10 years: 11% FRAX Score Hip fracture risk in 10 years: 2.1%  -Provided handout / discussed with her from [facility] resource about osteoporosis and possible treatments. She is going to review this information and let me know by next visit if she is comfortable starting on a bisphosphonate at this time or not.   -Encouraged her continued 1200mg  calcium and 800-1000IU/vitamin D daily.   -Repeat DEXA in 2 years.   -Labs today, will call with results   This note was prepared with assistance of Dragon voice recognition software. Occasional wrong-word or sound-a-like substitutions may have occurred due to the inherent limitations of voice recognition software.  Time Spent: 31 minutes of total time was spent on the date of the encounter performing the following actions: chart review prior to seeing the patient, obtaining history, performing a medically necessary exam, counseling on the treatment plan, placing orders, and documenting in our EHR.    Tiwanda Threats M Bexton Haak, PA-C

## 2021-06-21 NOTE — Patient Instructions (Addendum)
Good to meet you today! Please go to the lab for blood work and I will send results through MyChart.  Review the handout on osteoporosis and let me know your thoughts by next visit please.  Keep up the good work with exercise and nutrition!

## 2021-08-01 ENCOUNTER — Other Ambulatory Visit: Payer: Self-pay

## 2021-08-01 ENCOUNTER — Emergency Department (HOSPITAL_COMMUNITY)
Admission: EM | Admit: 2021-08-01 | Discharge: 2021-08-01 | Disposition: A | Discharge status: Home / Self Care | Payer: Commercial Managed Care - PPO | Attending: Emergency Medicine | Admitting: Emergency Medicine

## 2021-08-01 ENCOUNTER — Emergency Department (HOSPITAL_COMMUNITY): Payer: Commercial Managed Care - PPO

## 2021-08-01 ENCOUNTER — Encounter (HOSPITAL_COMMUNITY): Payer: Self-pay | Admitting: *Deleted

## 2021-08-01 DIAGNOSIS — R053 Chronic cough: Secondary | ICD-10-CM | POA: Insufficient documentation

## 2021-08-01 DIAGNOSIS — I10 Essential (primary) hypertension: Secondary | ICD-10-CM | POA: Insufficient documentation

## 2021-08-01 DIAGNOSIS — Z87891 Personal history of nicotine dependence: Secondary | ICD-10-CM | POA: Diagnosis not present

## 2021-08-01 DIAGNOSIS — E876 Hypokalemia: Secondary | ICD-10-CM | POA: Diagnosis not present

## 2021-08-01 DIAGNOSIS — R0602 Shortness of breath: Secondary | ICD-10-CM | POA: Diagnosis not present

## 2021-08-01 DIAGNOSIS — Z8616 Personal history of COVID-19: Secondary | ICD-10-CM | POA: Diagnosis not present

## 2021-08-01 DIAGNOSIS — R079 Chest pain, unspecified: Secondary | ICD-10-CM | POA: Diagnosis present

## 2021-08-01 LAB — CBC WITH DIFFERENTIAL/PLATELET
Abs Immature Granulocytes: 0.01 10*3/uL (ref 0.00–0.07)
Basophils Absolute: 0 10*3/uL (ref 0.0–0.1)
Basophils Relative: 1 %
Eosinophils Absolute: 0.1 10*3/uL (ref 0.0–0.5)
Eosinophils Relative: 1 %
HCT: 38.2 % (ref 36.0–46.0)
Hemoglobin: 13.3 g/dL (ref 12.0–15.0)
Immature Granulocytes: 0 %
Lymphocytes Relative: 41 %
Lymphs Abs: 2 10*3/uL (ref 0.7–4.0)
MCH: 30.3 pg (ref 26.0–34.0)
MCHC: 34.8 g/dL (ref 30.0–36.0)
MCV: 87 fL (ref 80.0–100.0)
Monocytes Absolute: 0.5 10*3/uL (ref 0.1–1.0)
Monocytes Relative: 10 %
Neutro Abs: 2.3 10*3/uL (ref 1.7–7.7)
Neutrophils Relative %: 47 %
Platelets: 269 10*3/uL (ref 150–400)
RBC: 4.39 MIL/uL (ref 3.87–5.11)
RDW: 12.1 % (ref 11.5–15.5)
WBC: 4.8 10*3/uL (ref 4.0–10.5)
nRBC: 0 % (ref 0.0–0.2)

## 2021-08-01 LAB — COMPREHENSIVE METABOLIC PANEL
ALT: 16 U/L (ref 0–44)
AST: 20 U/L (ref 15–41)
Albumin: 4.2 g/dL (ref 3.5–5.0)
Alkaline Phosphatase: 62 U/L (ref 38–126)
Anion gap: 7 (ref 5–15)
BUN: 9 mg/dL (ref 6–20)
CO2: 27 mmol/L (ref 22–32)
Calcium: 9.2 mg/dL (ref 8.9–10.3)
Chloride: 101 mmol/L (ref 98–111)
Creatinine, Ser: 0.73 mg/dL (ref 0.44–1.00)
GFR, Estimated: >60 mL/min (ref >60)
Glucose, Bld: 116 mg/dL — ABNORMAL HIGH (ref 70–99)
Potassium: 3.3 mmol/L — ABNORMAL LOW (ref 3.5–5.1)
Sodium: 135 mmol/L (ref 135–145)
Total Bilirubin: 0.8 mg/dL (ref 0.3–1.2)
Total Protein: 6.7 g/dL (ref 6.5–8.1)

## 2021-08-01 LAB — TROPONIN I (HIGH SENSITIVITY)
Troponin I (High Sensitivity): 4 ng/L (ref <18)
Troponin I (High Sensitivity): 3 ng/L (ref <18)

## 2021-08-01 MED ORDER — IOHEXOL 350 MG/ML SOLN
100.0000 mL | Freq: Once | INTRAVENOUS | Status: AC | PRN
  Administered 2021-08-01: 100 mL via INTRAVENOUS

## 2021-08-01 MED ORDER — POTASSIUM CHLORIDE CRYS ER 20 MEQ PO TBCR
40.0000 meq | EXTENDED_RELEASE_TABLET | Freq: Once | ORAL | Status: AC
  Administered 2021-08-01: 40 meq via ORAL
  Filled 2021-08-01: qty 2

## 2021-08-01 NOTE — Discharge Instructions (Signed)
If you develop recurrent, continued, or worsening chest pain, shortness of breath, fever, vomiting, abdominal or back pain, or any other new/concerning symptoms then return to the ER for evaluation.  

## 2021-08-01 NOTE — ED Provider Triage Note (Signed)
Emergency Medicine Provider Triage Evaluation Note  Marissa Gross , a 61 y.o. female  was evaluated in triage.  Pt complains of presents not feeling well, states that she woke up with chest pain which radiates into her back between her scapula, rates the pain 4 out of 10 she states is a sharp-like sensation, will radiate up and down her back, felt slightly better movement but now has remained unchanged, no cardiac history, no connective tissue disorders, never diagnosed with aneurysm or dissections, patient says she just does not feel right.  She has history of high blood pressure states is generally well controlled abnormal for her to be elevated..  Review of Systems  Positive: Chest pain, back pain Negative: Shortness of breath, abdominal pain  Physical Exam  BP (!) 172/79    Pulse 68    Temp 98.4 F (36.9 C)    Resp 18    Ht 5\' 4"  (1.626 m)    Wt 55.9 kg    LMP  (LMP Unknown)    SpO2 100%    BMI 21.15 kg/m  Gen:   Awake, no distress   Resp:  Normal effort  MSK:   Moves extremities without difficulty  Other:    Medical Decision Making  Medically screening exam initiated at 6:46 AM.  Appropriate orders placed.  Marissa Gross was informed that the remainder of the evaluation will be completed by another provider, this initial triage assessment does not replace that evaluation, and the importance of remaining in the ED until their evaluation is complete.  Presents with chest pain and back pain lab regimen ordered will need further work-up.   Donato Heinz, PA-C 08/01/21 [phone]

## 2021-08-01 NOTE — ED Provider Notes (Signed)
[facility] EMERGENCY DEPARTMENT Provider Note   CSN: [id number] Arrival date & time: 08/01/21  [id number]     History  Chief Complaint  Patient presents with   Chest Pain    Marissa Gross is a 61 y.o. female.  HPI 61 year old female presents with chest pain.  Started around 3 AM while she was asleep.  After that she could not get back to sleep.  Chest pain is inferior to her left breast and in the same area on her back so is not really going straight through.  Pain is a dull sensation.  Seems to be a lot better than it was before and has been intermittent ever since coming on.  Some shortness of breath with it.  Feels better when she gets up and walks around.  Has a chronic cough since COVID last year but no new cough.  No fevers.  She has a history of hypertension, mildly elevated cholesterol, and a very remote history of smoking.  Brother had a heart attack in his 53s.  Home Medications Prior to Admission medications   Medication Sig Start Date End Date Taking? Authorizing Provider  Calcium Acetate, Phos Binder, (CALCIUM ACETATE PO)     [provider]  hydrochlorothiazide (HYDRODIURIL) 25 MG tablet Take 1 tablet (25 mg total) by mouth daily. 06/21/21   Allwardt, Randa Evens, PA-C  ibuprofen (ADVIL,MOTRIN) 200 MG tablet Take 200 mg by mouth every 6 (six) hours as needed for mild pain (prn for general aches and pains).    [provider]  VITAMIN D, ERGOCALCIFEROL, PO     [provider]      Allergies    Lisinopril    Review of Systems   Review of Systems  Constitutional:  Negative for fever.  Respiratory:  Positive for shortness of breath.   Cardiovascular:  Positive for chest pain.  Gastrointestinal:  Negative for abdominal pain.  Musculoskeletal:  Positive for back pain.   Physical Exam Updated Vital Signs BP (!) 164/55 (BP Location: Right Arm)    Pulse (!) 52    Temp 98.4 F (36.9 C)    Resp 12    Ht 5\' 4"  (1.626 m)    Wt 55.9  kg    LMP  (LMP Unknown)    SpO2 100%    BMI 21.15 kg/m  Physical Exam Vitals and nursing note reviewed.  Constitutional:      General: She is not in acute distress.    Appearance: She is well-developed. She is not ill-appearing or diaphoretic.  HENT:     Head: Normocephalic and atraumatic.  Cardiovascular:     Rate and Rhythm: Normal rate and regular rhythm.     Heart sounds: Normal heart sounds.  Pulmonary:     Effort: Pulmonary effort is normal.     Breath sounds: Normal breath sounds.  Chest:     Chest wall: No tenderness.  Abdominal:     Palpations: Abdomen is soft.     Tenderness: There is no abdominal tenderness.  Skin:    General: Skin is warm and dry.  Neurological:     Mental Status: She is alert.    ED Results / Procedures / Treatments   Labs (all labs ordered are listed, but only abnormal results are displayed) Labs Reviewed  COMPREHENSIVE METABOLIC PANEL - Abnormal; Notable for the following components:      Result Value   Potassium 3.3 (*)    Glucose, Bld 116 (*)  All other components within normal limits  CBC WITH DIFFERENTIAL/PLATELET  TROPONIN I (HIGH SENSITIVITY)  TROPONIN I (HIGH SENSITIVITY)    EKG EKG Interpretation  Date/Time:  Monday August 01 2021 06:36:42 EST Ventricular Rate:  62 PR Interval:  144 QRS Duration: 94 QT Interval:  416 QTC Calculation: 422 R Axis:   82 Text Interpretation: Normal sinus rhythm with sinus arrhythmia ST & T wave abnormality, consider inferior ischemia Abnormal ECG No previous ECGs available Confirmed by Delora Fuel ([id number]) on 08/01/2021 7:10:42 AM  Radiology DG Chest 2 View  Result Date: 08/01/2021 CLINICAL DATA:  Chest pain, shortness of breath EXAM: CHEST - 2 VIEW COMPARISON:  None. FINDINGS: Cardiac size is within normal limits. Increase in AP diameter of chest and flattening of diaphragms suggests COPD. There are no signs of pulmonary edema or focal pulmonary consolidation. Faint nodular densities in  the lower lung fields on both sides most likely suggest nipple shadows. There is no pleural effusion or pneumothorax. IMPRESSION: COPD. There are no signs of pulmonary edema or focal pulmonary consolidation. Electronically Signed   By: Elmer Picker M.D.   On: 08/01/2021 08:03   CT Angio Chest/Abd/Pel for Dissection W and/or Wo Contrast  Result Date: 08/01/2021 CLINICAL DATA:  Acute chest and back pain. EXAM: CT ANGIOGRAPHY CHEST, ABDOMEN AND PELVIS TECHNIQUE: Non-contrast CT of the chest was initially obtained. Multidetector CT imaging through the chest, abdomen and pelvis was performed using the standard protocol during bolus administration of intravenous contrast. Multiplanar reconstructed images and MIPs were obtained and reviewed to evaluate the vascular anatomy. RADIATION DOSE REDUCTION: This exam was performed according to the departmental dose-optimization program which includes automated exposure control, adjustment of the mA and/or kV according to patient size and/or use of iterative reconstruction technique. CONTRAST:  112mL OMNIPAQUE IOHEXOL 350 MG/ML SOLN COMPARISON:  None. FINDINGS: CTA CHEST FINDINGS Cardiovascular: Preferential opacification of the thoracic aorta. No evidence of thoracic aortic aneurysm or dissection. Normal heart size. No pericardial effusion. Mediastinum/Nodes: No enlarged mediastinal, hilar, or axillary lymph nodes. Thyroid gland, trachea, and esophagus demonstrate no significant findings. Lungs/Pleura: Lungs are clear. No pleural effusion or pneumothorax. Musculoskeletal: No chest wall abnormality. No acute or significant osseous findings. Review of the MIP images confirms the above findings. CTA ABDOMEN AND PELVIS FINDINGS VASCULAR Aorta: Normal caliber aorta without aneurysm, dissection, vasculitis or significant stenosis. Celiac: Patent without evidence of aneurysm, dissection, vasculitis or significant stenosis. SMA: Patent without evidence of aneurysm, dissection,  vasculitis or significant stenosis. Renals: Both renal arteries are patent without evidence of aneurysm, dissection, vasculitis, fibromuscular dysplasia or significant stenosis. IMA: Patent without evidence of aneurysm, dissection, vasculitis or significant stenosis. Inflow: Patent without evidence of aneurysm, dissection, vasculitis or significant stenosis. Veins: No obvious venous abnormality within the limitations of this arterial phase study. Review of the MIP images confirms the above findings. NON-VASCULAR Hepatobiliary: No focal liver abnormality is seen. No gallstones, gallbladder wall thickening, or biliary dilatation. Pancreas: Unremarkable. No pancreatic ductal dilatation or surrounding inflammatory changes. Spleen: Normal in size without focal abnormality. Adrenals/Urinary Tract: Adrenal glands are unremarkable. Kidneys are normal, without renal calculi, focal lesion, or hydronephrosis. Bladder is unremarkable. Stomach/Bowel: Stomach is within normal limits. Appendix appears normal. No evidence of bowel wall thickening, distention, or inflammatory changes. Lymphatic: No adenopathy is noted. Reproductive: Uterus and bilateral adnexa are unremarkable. Other: No abdominal wall hernia or abnormality. No abdominopelvic ascites. Musculoskeletal: No acute or significant osseous findings. Review of the MIP images confirms the above findings. IMPRESSION: No definite  evidence of thoracic or abdominal aortic dissection or aneurysm. No definite evidence of mesenteric or renal artery stenosis. No definite abnormality seen in the chest, abdomen or pelvis. Electronically Signed   By: Marijo Conception M.D.   On: 08/01/2021 09:37    Procedures Procedures    Medications Ordered in ED Medications  iohexol (OMNIPAQUE) 350 MG/ML injection 100 mL (100 mLs Intravenous Contrast Given 08/01/21 0901)    ED Course/ Medical Decision Making/ A&P           HEART Score: 4                Medical Decision Making  Patient  presents with nonspecific chest pain.  Unclear cause.  However with her getting up and walking around making it feel better, I am less suspicious for ACS/cardiac disease.  She does have some risk factors including hypertension, hyperlipidemia, and prior family history of MI at a young age.  Her ECG has been reviewed and interpreted and is nonspecific but when I did pull up her ECG from 2021 it appears similar to me.  Her labs have been reviewed and interpreted and show a slight hypokalemia but negative troponins.  Chest x-ray has been personally reviewed and interpreted and is overall benign.  CTA of the chest was obtained and shows no dissection or other emergent finding.  Chart review shows she is currently on HCTZ and has been following with her PCP for the hypertension.  Overall, I do not suspect ACS, and I have lower suspicion for PE and dissection seems to have been ruled out with a CTA.  Her heart score is a 4 and so we did discuss admission versus follow-up with PCP and/or cardiology.  She would prefer to be discharged and follow-up with her PCP.  I think this is reasonable and we have discussed return precautions.        Final Clinical Impression(s) / ED Diagnoses Final diagnoses:  Nonspecific chest pain    Rx / DC Orders ED Discharge Orders     None         Sherwood Gambler, MD 08/01/21 1254

## 2021-08-01 NOTE — ED Triage Notes (Signed)
The pt woke up with shortness of breath with chest pain through to her posterior chest  at present she feels like she cannot get her breath  02 sats 100%  no previous history

## 2021-09-06 DIAGNOSIS — S82143A Displaced bicondylar fracture of unspecified tibia, initial encounter for closed fracture: Secondary | ICD-10-CM | POA: Insufficient documentation

## 2022-01-16 ENCOUNTER — Encounter: Payer: Self-pay | Admitting: Family

## 2022-01-16 ENCOUNTER — Ambulatory Visit (INDEPENDENT_AMBULATORY_CARE_PROVIDER_SITE_OTHER): Payer: Commercial Managed Care - PPO | Admitting: Family

## 2022-01-16 VITALS — BP 128/62 | HR 55 | Temp 98.0°F | Ht 64.0 in | Wt 128.0 lb

## 2022-01-16 DIAGNOSIS — K5900 Constipation, unspecified: Secondary | ICD-10-CM | POA: Insufficient documentation

## 2022-01-16 DIAGNOSIS — H9202 Otalgia, left ear: Secondary | ICD-10-CM

## 2022-01-16 DIAGNOSIS — Z860100 Personal history of colon polyps, unspecified: Secondary | ICD-10-CM | POA: Insufficient documentation

## 2022-01-16 DIAGNOSIS — K219 Gastro-esophageal reflux disease without esophagitis: Secondary | ICD-10-CM | POA: Insufficient documentation

## 2022-01-16 DIAGNOSIS — H6122 Impacted cerumen, left ear: Secondary | ICD-10-CM

## 2022-01-16 DIAGNOSIS — Z800 Family history of malignant neoplasm of digestive organs: Secondary | ICD-10-CM | POA: Insufficient documentation

## 2022-01-16 DIAGNOSIS — R141 Gas pain: Secondary | ICD-10-CM | POA: Insufficient documentation

## 2022-01-16 DIAGNOSIS — Z1211 Encounter for screening for malignant neoplasm of colon: Secondary | ICD-10-CM | POA: Insufficient documentation

## 2022-01-16 NOTE — Patient Instructions (Signed)
It was very nice to see you today!    We were able to remove all of the was from your ear and there is some redness in the ear canal which is normal, and this may be a little sore for  the next few hours.   But call the office or send a message via MyChart if your ear continues to have the sharp pain you were having before we cleaned it out.      PLEASE NOTE:  If you had any lab tests please let us know if you have not heard back within a few days. You may see your results on MyChart before we have a chance to review them but we will give you a call once they are reviewed by [place]. If we ordered any referrals today, please let us know if you have not heard from their office within the next week.

## 2022-01-16 NOTE — Progress Notes (Signed)
Patient ID: Marissa Gross, female    DOB: February 05, 1961, 61 y.o.   MRN: [id number]  Chief Complaint  Patient presents with   Ear Pain    Pt c/o left ear pain and fullness for about 2 days. Has tried debrox which has made it worse. Also took Advil which did not help pain.      HPI: Ear pain - pt describes a 2 day history of Left ear pain with fullness, hearing loss, pressure. Denies associated discharge, itching, fever or dizziness. Tried using Debrox at home. Denies any sinus drainage or congestion, denies swimming.    Assessment & Plan:  1. Acute ear pain, left After ear washing, pt reports less fullness and pressure, but ear is sore.  TM appears dully gray & scarred, no erythema or bulging. Ear canal with erythema, but not bleeding. Advised on using Advil for soreness today, but if noticing sharp pain again, call the office.  2. Impacted cerumen of left ear Verbal consent received to perform left ear lavage via Hydrogen peroxide/water mix solution. Pt tolerated well, complete evacuation of all cerumen obtained. Mild erythema but no bleeding noted in ear canals after procedure.  - Ear Lavage   Subjective:    Outpatient Medications Prior to Visit  Medication Sig Dispense Refill   Calcium Acetate, Phos Binder, (CALCIUM ACETATE PO)      hydrochlorothiazide (HYDRODIURIL) 25 MG tablet Take 1 tablet (25 mg total) by mouth daily. 90 tablet 3   ibuprofen (ADVIL,MOTRIN) 200 MG tablet Take 200 mg by mouth every 6 (six) hours as needed for mild pain (prn for general aches and pains).     VITAMIN D, ERGOCALCIFEROL, PO      No facility-administered medications prior to visit.   Past Medical History:  Diagnosis Date   Allergy    Colon polyps    GERD (gastroesophageal reflux disease)    History of chicken pox    Past Surgical History:  Procedure Laterality Date   TONSILLECTOMY     Allergies  Allergen Reactions   Lisinopril       Objective:    Physical Exam Vitals and nursing  note reviewed.  Constitutional:      Appearance: Normal appearance.  HENT:     Right Ear: Tympanic membrane and ear canal normal.     Left Ear: There is impacted cerumen (TM appears scarred after wax removal). Tympanic membrane is scarred. Tympanic membrane is not erythematous or bulging.  Cardiovascular:     Rate and Rhythm: Normal rate and regular rhythm.  Pulmonary:     Effort: Pulmonary effort is normal.     Breath sounds: Normal breath sounds.  Musculoskeletal:        General: Normal range of motion.  Skin:    General: Skin is warm and dry.  Neurological:     Mental Status: She is alert.  Psychiatric:        Mood and Affect: Mood normal.        Behavior: Behavior normal.    BP 128/62 (BP Location: Left Arm, Patient Position: Sitting, Cuff Size: Large)   Pulse (!) 55   Temp 98 F (36.7 C) (Temporal)   Ht 5\' 4"  (1.626 m)   Wt 128 lb (58.1 kg)   LMP  (LMP Unknown)   SpO2 99%   BMI 21.97 kg/m  Wt Readings from Last 3 Encounters:  01/16/22 128 lb (58.1 kg)  08/01/21 123 lb 3.8 oz (55.9 kg)  06/21/21 123 lb 4 oz (  55.9 kg)       Dulce Sellar, NP

## 2022-01-17 ENCOUNTER — Ambulatory Visit: Payer: Commercial Managed Care - PPO | Admitting: Physician Assistant

## 2022-03-07 ENCOUNTER — Other Ambulatory Visit: Payer: Self-pay | Admitting: Family

## 2022-04-03 ENCOUNTER — Encounter: Payer: Self-pay | Admitting: *Deleted

## 2022-06-02 LAB — HM PAP SMEAR: HPV, high-risk: NEGATIVE

## 2022-06-22 ENCOUNTER — Encounter: Payer: Self-pay | Admitting: *Deleted

## 2022-07-13 ENCOUNTER — Ambulatory Visit (INDEPENDENT_AMBULATORY_CARE_PROVIDER_SITE_OTHER): Payer: Commercial Managed Care - PPO | Admitting: Physician Assistant

## 2022-07-13 ENCOUNTER — Encounter: Payer: Self-pay | Admitting: Physician Assistant

## 2022-07-13 VITALS — BP 138/70 | HR 51 | Temp 97.7°F | Ht 64.0 in | Wt 124.4 lb

## 2022-07-13 DIAGNOSIS — E782 Mixed hyperlipidemia: Secondary | ICD-10-CM | POA: Diagnosis not present

## 2022-07-13 DIAGNOSIS — Z1211 Encounter for screening for malignant neoplasm of colon: Secondary | ICD-10-CM | POA: Diagnosis not present

## 2022-07-13 DIAGNOSIS — I10 Essential (primary) hypertension: Secondary | ICD-10-CM

## 2022-07-13 DIAGNOSIS — E559 Vitamin D deficiency, unspecified: Secondary | ICD-10-CM

## 2022-07-13 DIAGNOSIS — M810 Age-related osteoporosis without current pathological fracture: Secondary | ICD-10-CM

## 2022-07-13 LAB — CBC WITH DIFFERENTIAL/PLATELET
Basophils Absolute: 0 10*3/uL (ref 0.0–0.1)
Basophils Relative: 0.9 % (ref 0.0–3.0)
Eosinophils Absolute: 0 10*3/uL (ref 0.0–0.7)
Eosinophils Relative: 0.8 % (ref 0.0–5.0)
HCT: 39.8 % (ref 36.0–46.0)
Hemoglobin: 13.7 g/dL (ref 12.0–15.0)
Lymphocytes Relative: 35.1 % (ref 12.0–46.0)
Lymphs Abs: 1.5 10*3/uL (ref 0.7–4.0)
MCHC: 34.3 g/dL (ref 30.0–36.0)
MCV: 88.5 fl (ref 78.0–100.0)
Monocytes Absolute: 0.4 10*3/uL (ref 0.1–1.0)
Monocytes Relative: 8.6 % (ref 3.0–12.0)
Neutro Abs: 2.3 10*3/uL (ref 1.4–7.7)
Neutrophils Relative %: 54.6 % (ref 43.0–77.0)
Platelets: 296 10*3/uL (ref 150.0–400.0)
RBC: 4.5 Mil/uL (ref 3.87–5.11)
RDW: 12.7 % (ref 11.5–15.5)
WBC: 4.2 10*3/uL (ref 4.0–10.5)

## 2022-07-13 LAB — COMPREHENSIVE METABOLIC PANEL
ALT: 13 U/L (ref 0–35)
AST: 18 U/L (ref 0–37)
Albumin: 4.6 g/dL (ref 3.5–5.2)
Alkaline Phosphatase: 61 U/L (ref 39–117)
BUN: 9 mg/dL (ref 6–23)
CO2: 30 mEq/L (ref 19–32)
Calcium: 10 mg/dL (ref 8.4–10.5)
Chloride: 101 mEq/L (ref 96–112)
Creatinine, Ser: 0.71 mg/dL (ref 0.40–1.20)
GFR: 91.58 mL/min (ref >60.00)
Glucose, Bld: 105 mg/dL — ABNORMAL HIGH (ref 70–99)
Potassium: 4.3 mEq/L (ref 3.5–5.1)
Sodium: 138 mEq/L (ref 135–145)
Total Bilirubin: 0.6 mg/dL (ref 0.2–1.2)
Total Protein: 6.8 g/dL (ref 6.0–8.3)

## 2022-07-13 LAB — LIPID PANEL
Cholesterol: 283 mg/dL — ABNORMAL HIGH (ref 0–200)
HDL: 103.1 mg/dL (ref >39.00)
LDL Cholesterol: 150 mg/dL — ABNORMAL HIGH (ref 0–99)
NonHDL: 180.17
Total CHOL/HDL Ratio: 3
Triglycerides: 152 mg/dL — ABNORMAL HIGH (ref 0.0–149.0)
VLDL: 30.4 mg/dL (ref 0.0–40.0)

## 2022-07-13 LAB — VITAMIN D 25 HYDROXY (VIT D DEFICIENCY, FRACTURES): VITD: 28.36 ng/mL — ABNORMAL LOW (ref 30.00–100.00)

## 2022-07-13 LAB — TSH: TSH: 1.61 u[IU]/mL (ref 0.35–5.50)

## 2022-07-13 MED ORDER — HYDROCHLOROTHIAZIDE 25 MG PO TABS: 25.0000 mg | ORAL_TABLET | Freq: Every day | ORAL | 3 refills | Status: DC

## 2022-07-13 NOTE — Assessment & Plan Note (Signed)
She will have follow-up with her gynecologist in this regard.  Currently not taking any medications.  Currently doing more weightbearing exercises, calcium and vitamin D supplements.

## 2022-07-13 NOTE — Assessment & Plan Note (Signed)
Blood pressure stable, normotensive.  Continue on HCTZ 25 mg daily.  Refilled this medication today.  Continue to keep up good work with lifestyle.

## 2022-07-13 NOTE — Assessment & Plan Note (Signed)
Patient is going to have records from her last colonoscopy sent to [place].  She has a history of polyps and says there has been some confusion about when she is due for her next colon cancer screening.

## 2022-07-13 NOTE — Progress Notes (Signed)
Subjective:    Patient ID: Marissa Gross, female    DOB: [birth year], 62 y.o.   MRN: [id number]  Chief Complaint  Patient presents with   Hypertension   Medication Refill    HPI Patient is in today for annual follow-up. Needing labs and HTN rechecked. Feeling well.   Interim hx - Feb 2023 - fracture R knee / leg; treated with Dr. Veverly Fells; all back to normal per patient.  -Discussed osteoporosis with him and ob/gyn - they will recheck at next visit to look at stability / medication options if needed.   Past Medical History:  Diagnosis Date   Allergy    Colon polyps    GERD (gastroesophageal reflux disease)    History of chicken pox     Past Surgical History:  Procedure Laterality Date   TONSILLECTOMY      Family History  Problem Relation Age of Onset   Arthritis Mother    Cancer Mother    Hyperlipidemia Mother    Arthritis Father    Cancer Father    Hypertension Father    Hyperlipidemia Father    Heart attack Brother    Birth defects Brother    Diabetes Brother    Hyperlipidemia Brother     Social History   Tobacco Use   Smoking status: Former    Types: Cigarettes   Smokeless tobacco: Never  Scientific laboratory technician Use: Never used  Substance Use Topics   Alcohol use: Yes   Drug use: Never     Allergies  Allergen Reactions   Lisinopril     Review of Systems NEGATIVE UNLESS OTHERWISE INDICATED IN HPI      Objective:     BP 138/70 (BP Location: Left Arm, Patient Position: Sitting)   Pulse (!) 51   Temp 97.7 F (36.5 C) (Temporal)   Ht 5\' 4"  (1.626 m)   Wt 124 lb 6.4 oz (56.4 kg)   LMP  (LMP Unknown)   SpO2 100%   BMI 21.35 kg/m   Wt Readings from Last 3 Encounters:  07/13/22 124 lb 6.4 oz (56.4 kg)  01/16/22 128 lb (58.1 kg)  08/01/21 123 lb 3.8 oz (55.9 kg)    BP Readings from Last 3 Encounters:  07/13/22 138/70  01/16/22 128/62  08/01/21 (!) 164/55     Physical Exam Vitals and nursing note reviewed.  Constitutional:       Appearance: Normal appearance. She is normal weight. She is not toxic-appearing.  HENT:     Head: Normocephalic and atraumatic.     Right Ear: External ear normal.     Left Ear: External ear normal.     Nose: Nose normal.     Mouth/Throat:     Mouth: Mucous membranes are moist.  Eyes:     Extraocular Movements: Extraocular movements intact.     Conjunctiva/sclera: Conjunctivae normal.     Pupils: Pupils are equal, round, and reactive to light.  Cardiovascular:     Rate and Rhythm: Regular rhythm. Bradycardia present.     Heart sounds: Normal heart sounds.  Pulmonary:     Effort: Pulmonary effort is normal.     Breath sounds: Normal breath sounds.  Musculoskeletal:        General: Normal range of motion.     Cervical back: Normal range of motion and neck supple.  Skin:    General: Skin is warm and dry.  Neurological:     General: No focal deficit present.  Mental Status: She is alert and oriented to person, place, and time.  Psychiatric:        Mood and Affect: Mood normal.        Behavior: Behavior normal.        Thought Content: Thought content normal.        Judgment: Judgment normal.        Assessment & Plan:  Essential hypertension Assessment & Plan: Blood pressure stable, normotensive.  Continue on HCTZ 25 mg daily.  Refilled this medication today.  Continue to keep up good work with lifestyle.  Orders: -     CBC with Differential/Platelet -     Comprehensive metabolic panel -     TSH  Osteoporosis of femur without pathological fracture Assessment & Plan: She will have follow-up with her gynecologist in this regard.  Currently not taking any medications.  Currently doing more weightbearing exercises, calcium and vitamin D supplements.   Screening for colon cancer -     Ambulatory referral to Gastroenterology  Vitamin D deficiency -     VITAMIN D 25 Hydroxy (Vit-D Deficiency, Fractures)  Mixed hyperlipidemia -     Lipid panel  Colon cancer  screening Assessment & Plan: Patient is going to have records from her last colonoscopy sent to [place].  She has a history of polyps and says there has been some confusion about when she is due for her next colon cancer screening.   Other orders -     hydroCHLOROthiazide; Take 1 tablet (25 mg total) by mouth daily.  Dispense: 90 tablet; Refill: 3        Return in about 1 year (around 07/14/2023) for annual follow up with labs .  This note was prepared with assistance of Systems analyst. Occasional wrong-word or sound-a-like substitutions may have occurred due to the inherent limitations of voice recognition software.   Yobana Culliton M Robben Jagiello, PA-C

## 2022-07-19 ENCOUNTER — Telehealth: Payer: Self-pay | Admitting: Physician Assistant

## 2022-07-19 NOTE — Telephone Encounter (Signed)
Patient requests to be called at ph# [phone] to discuss Lab results Patient received on MyChart.

## 2022-07-19 NOTE — Telephone Encounter (Signed)
Please see lab results.

## 2022-07-27 ENCOUNTER — Encounter: Payer: Self-pay | Admitting: Physician Assistant

## 2022-07-27 NOTE — Telephone Encounter (Signed)
Pt is requesting provider call to discuss results, please advise

## 2022-08-04 ENCOUNTER — Other Ambulatory Visit: Payer: Self-pay | Admitting: Physician Assistant

## 2022-08-04 ENCOUNTER — Telehealth: Payer: Self-pay | Admitting: Internal Medicine

## 2022-08-04 DIAGNOSIS — E782 Mixed hyperlipidemia: Secondary | ICD-10-CM

## 2022-08-04 NOTE — Telephone Encounter (Signed)
Hi Dr. Lorenso Courier,    We received a referral for patient to have another colonoscopy. She does have GI history with Dr. Collene Mares was not pleased with their office. She is specifically asking for you to continue her GI care if possible. Her records were obtained for you to review and advise on scheduling.   Thanks

## 2022-08-09 ENCOUNTER — Encounter: Payer: Self-pay | Admitting: Internal Medicine

## 2022-08-09 NOTE — Progress Notes (Signed)
Reviewed records from Dr. Lorie Apley office. Okay to schedule for direct colonoscopy for history of colon polyps  Colonoscopy 08/07/11: One small sessile rectosigmoid polyp removed with cold snare. Otherwise normal. Good prep.  Path: TA.  Colonoscopy 09/09/18: 12 mm sessile polyp in the proximal ascending colon that was resected with hot snare after injecting the base with 6 cc of saline for mucosal lift. The resection was incomplete but resected tissue was retrieved. A small sessile polyp was found in the rectosigmoid area that was removed with cold biopsy. Otherwise normal. Good prep.  Path: TVA for ascending colon polyp. HP for rectosigmoid polyp.  Colonoscopy 01/27/19: 7 mm sessile polyp in the mid-ascending colon was removed with hot snare. Otherwise normal. Adequate prep. Path: SSA

## 2022-08-14 ENCOUNTER — Encounter: Payer: Self-pay | Admitting: Internal Medicine

## 2022-08-25 ENCOUNTER — Ambulatory Visit
Admission: RE | Admit: 2022-08-25 | Discharge: 2022-08-25 | Disposition: A | Discharge status: Home / Self Care | Payer: Commercial Managed Care - PPO | Source: Ambulatory Visit | Attending: Physician Assistant | Admitting: Physician Assistant

## 2022-08-25 DIAGNOSIS — E782 Mixed hyperlipidemia: Secondary | ICD-10-CM

## 2022-09-07 ENCOUNTER — Ambulatory Visit (AMBULATORY_SURGERY_CENTER): Payer: Commercial Managed Care - PPO

## 2022-09-07 VITALS — Ht 64.0 in | Wt 120.0 lb

## 2022-09-07 DIAGNOSIS — Z86010 Personal history of colonic polyps: Secondary | ICD-10-CM

## 2022-09-07 MED ORDER — NA SULFATE-K SULFATE-MG SULF 17.5-3.13-1.6 GM/177ML PO SOLN: 1.0000 | Freq: Once | ORAL | 0 refills | Status: AC

## 2022-09-07 NOTE — Progress Notes (Signed)
No egg or soy allergy known to patient  Hx of PONV  Patient denies ever being told they had issues or difficulty with intubation  No FH of Malignant Hyperthermia Pt is not on diet pills Pt is not on  home 02  Pt is not on blood thinners  Pt denies issues with constipation  No A fib or A flutter Have any cardiac testing pending--no Pt instructed to use Singlecare.com or GoodRx for a price reduction on prep   Patient's chart reviewed by Osvaldo Angst CNRA prior to previsit and patient appropriate for the [place].  Previsit completed and red dot placed by patient's name on their procedure day (on provider's schedule).

## 2022-09-08 HISTORY — PX: POLYPECTOMY: SHX149

## 2022-09-12 ENCOUNTER — Encounter: Payer: Self-pay | Admitting: Internal Medicine

## 2022-10-03 ENCOUNTER — Encounter: Payer: Self-pay | Admitting: Internal Medicine

## 2022-10-03 ENCOUNTER — Ambulatory Visit (AMBULATORY_SURGERY_CENTER): Payer: Commercial Managed Care - PPO | Admitting: Internal Medicine

## 2022-10-03 VITALS — BP 100/59 | HR 54 | Temp 97.3°F | Resp 10 | Ht 64.0 in | Wt 123.4 lb

## 2022-10-03 DIAGNOSIS — Z09 Encounter for follow-up examination after completed treatment for conditions other than malignant neoplasm: Secondary | ICD-10-CM | POA: Diagnosis present

## 2022-10-03 DIAGNOSIS — D122 Benign neoplasm of ascending colon: Secondary | ICD-10-CM

## 2022-10-03 DIAGNOSIS — Z86010 Personal history of colonic polyps: Secondary | ICD-10-CM

## 2022-10-03 HISTORY — PX: COLONOSCOPY WITH PROPOFOL: SHX5780

## 2022-10-03 MED ORDER — SODIUM CHLORIDE 0.9 % IV SOLN: 500.0000 mL | Freq: Once | INTRAVENOUS | Status: DC

## 2022-10-03 NOTE — Progress Notes (Signed)
Called to room to assist during endoscopic procedure.  Patient ID and intended procedure confirmed with present staff. Received instructions for my participation in the procedure from the performing physician.  

## 2022-10-03 NOTE — Progress Notes (Signed)
Pt's states no medical or surgical changes since previsit or office visit. VS assessed by D.T 

## 2022-10-03 NOTE — Op Note (Signed)
[facility] Patient Name: Marissa Gross Procedure Date: 10/03/2022 8:07 AM MRN: [id number] Endoscopist: Adline Mango [place] , , [id number] Age: 62 Referring MD:  Date of Birth: January 22, 1961 Gender: Female Account #: [id number] Procedure:                Colonoscopy Indications:              High risk colon cancer surveillance: Personal                            history of colonic polyps Medicines:                Monitored Anesthesia Care Procedure:                Pre-Anesthesia Assessment:                           - Prior to the procedure, a History and Physical                            was performed, and patient medications and                            allergies were reviewed. The patient's tolerance of                            previous anesthesia was also reviewed. The risks                            and benefits of the procedure and the sedation                            options and risks were discussed with the patient.                            All questions were answered, and informed consent                            was obtained. Prior Anticoagulants: The patient has                            taken no anticoagulant or antiplatelet agents. ASA                            Grade Assessment: II - A patient with mild systemic                            disease. After reviewing the risks and benefits,                            the patient was deemed in satisfactory condition to                            undergo the procedure.  After obtaining informed consent, the colonoscope                            was passed under direct vision. Throughout the                            procedure, the patient's blood pressure, pulse, and                            oxygen saturations were monitored continuously. The                            PCF-HQ190L Colonoscope [id number] was introduced                            through the anus and advanced to the  the cecum,                            identified by appendiceal orifice and ileocecal                            valve. The colonoscopy was performed without                            difficulty. The patient tolerated the procedure                            well. The quality of the bowel preparation was                            good. The ileocecal valve, appendiceal orifice, and                            rectum were photographed. Scope In: 8:13:37 AM Scope Out: 9:26:10 AM Scope Withdrawal Time: 0 hours 58 minutes 8 seconds  Total Procedure Duration: 1 hour 12 minutes 33 seconds  Findings:                 A 25 mm polyp was found in the ascending colon. The                            polyp was sessile. Preparations were made for                            mucosal resection. Saline was injected to raise the                            lesion. Piecemeal mucosal resection using a cold                            snare and forceps was performed. Areas of the polyp                            were scarred down, suggesting  scarring from prior                            polypectomy removal. Resection and retrieval were                            complete. Snare tip cautery was applied to the                            polypectomy edges. Area distal and on the opposite                            wall of the polyp was tattooed with an injection of                            0.5 mL of Spot (carbon black).                           Non-bleeding internal hemorrhoids were found during                            retroflexion. Complications:            No immediate complications. Estimated Blood Loss:     Estimated blood loss was minimal. Impression:               - One 25 mm polyp in the ascending colon, removed                            with mucosal resection. Resected and retrieved.                            Tattooed.                           - Mucosal resection was performed. Resection and                             retrieval were complete.                           - Non-bleeding internal hemorrhoids. Recommendation:           - Discharge patient to home (with escort).                           - Await pathology results.                           - Repeat colonoscopy in 6 months for surveillance                            after piecemeal polypectomy.                           - The findings and recommendations were discussed  with the patient. Dr Georgian Co "[place]" Bolton,  10/03/2022 9:36:08 AM

## 2022-10-03 NOTE — Progress Notes (Signed)
Vss nad trans to pacu 

## 2022-10-03 NOTE — Patient Instructions (Signed)
Thank you for letting us take care of your healthcare needs today. Please see handouts given to you on Polyps and Hemorrhoids. Repeat colonscopy in 6 months, the office will call to schedule with you. Await Pathology results. You may resume your normal medications.    YOU HAD AN ENDOSCOPIC PROCEDURE TODAY AT [facility]:   Refer to the procedure report that was given to you for any specific questions about what was found during the examination.  If the procedure report does not answer your questions, please call your gastroenterologist to clarify.  If you requested that your care partner not be given the details of your procedure findings, then the procedure report has been included in a sealed envelope for you to review at your convenience later.  YOU SHOULD EXPECT: Some feelings of bloating in the abdomen. Passage of more gas than usual.  Walking can help get rid of the air that was put into your GI tract during the procedure and reduce the bloating. If you had a lower endoscopy (such as a colonoscopy or flexible sigmoidoscopy) you may notice spotting of blood in your stool or on the toilet paper. If you underwent a bowel prep for your procedure, you may not have a normal bowel movement for a few days.  Please Note:  You might notice some irritation and congestion in your nose or some drainage.  This is from the oxygen used during your procedure.  There is no need for concern and it should clear up in a day or so.  SYMPTOMS TO REPORT IMMEDIATELY:  Following lower endoscopy (colonoscopy or flexible sigmoidoscopy):  Excessive amounts of blood in the stool  Significant tenderness or worsening of abdominal pains  Swelling of the abdomen that is new, acute  Fever of 100F or higher    For urgent or emergent issues, a gastroenterologist can be reached at any hour by calling [phone]. Do not use MyChart messaging for urgent concerns.    DIET:  We do recommend a small  meal at first, but then you may proceed to your regular diet.  Drink plenty of fluids but you should avoid alcoholic beverages for 24 hours.  ACTIVITY:  You should plan to take it easy for the rest of today and you should NOT DRIVE or use heavy machinery until tomorrow (because of the sedation medicines used during the test).    FOLLOW UP: Our staff will call the number listed on your records the next business day following your procedure.  We will call around 7:15- 8:00 am to check on you and address any questions or concerns that you may have regarding the information given to you following your procedure. If we do not reach you, we will leave a message.     If any biopsies were taken you will be contacted by phone or by letter within the next 1-3 weeks.  Please call us at [phone] if you have not heard about the biopsies in 3 weeks.    SIGNATURES/CONFIDENTIALITY: You and/or your care partner have signed paperwork which will be entered into your electronic medical record.  These signatures attest to the fact that that the information above on your After Visit Summary has been reviewed and is understood.  Full responsibility of the confidentiality of this discharge information lies with you and/or your care-partner.

## 2022-10-03 NOTE — Progress Notes (Signed)
GASTROENTEROLOGY PROCEDURE H&P NOTE   Primary Care Physician: Allwardt, Randa Evens, PA-C    Reason for Procedure:   History of colon polyps  Plan:    Colonoscopy  Patient is appropriate for endoscopic procedure(s) in the ambulatory ([facility]) setting.  The nature of the procedure, as well as the risks, benefits, and alternatives were carefully and thoroughly reviewed with the patient. Ample time for discussion and questions allowed. The patient understood, was satisfied, and agreed to proceed.     HPI: Marissa Gross is a 62 y.o. female who presents for colonoscopy for history of colon polyps. Denies blood in stools, changes in bowel habits, or unintentional weight loss. Denies family history of colon cancer.  Colonoscopy 08/07/11: One small sessile rectosigmoid polyp removed with cold snare. Otherwise normal. Good prep.  Path: TA.   Colonoscopy 09/09/18: 12 mm sessile polyp in the proximal ascending colon that was resected with hot snare after injecting the base with 6 cc of saline for mucosal lift. The resection was incomplete but resected tissue was retrieved. A small sessile polyp was found in the rectosigmoid area that was removed with cold biopsy. Otherwise normal. Good prep.  Path: TVA for ascending colon polyp. HP for rectosigmoid polyp.   Colonoscopy 01/27/19: 7 mm sessile polyp in the mid-ascending colon was removed with hot snare. Otherwise normal. Adequate prep. Path: SSA  Past Medical History:  Diagnosis Date   Allergy    Colon polyps    Elevated cholesterol    GERD (gastroesophageal reflux disease)    History of chicken pox    Hypertension    Osteoporosis     Past Surgical History:  Procedure Laterality Date   COLONOSCOPY     TONSILLECTOMY      Prior to Admission medications   Medication Sig Start Date End Date Taking? Authorizing Provider  Calcium Acetate, Phos Binder, (CALCIUM ACETATE PO)    Yes [provider]  CALCIUM MAGNESIUM ZINC PO Take 1  tablet by mouth daily.   Yes [provider]  cholecalciferol (VITAMIN D3) 25 MCG (1000 UNIT) tablet Take 1,000 Units by mouth daily. 3-4 tablet daily   Yes [provider]  hydrochlorothiazide (HYDRODIURIL) 25 MG tablet Take 1 tablet (25 mg total) by mouth daily. 07/13/22  Yes Allwardt, Alyssa M, PA-C  ibuprofen (ADVIL,MOTRIN) 200 MG tablet Take 200 mg by mouth every 6 (six) hours as needed for mild pain (prn for general aches and pains). Patient not taking: Reported on 09/07/2022    [provider]    Current Outpatient Medications  Medication Sig Dispense Refill   Calcium Acetate, Phos Binder, (CALCIUM ACETATE PO)      CALCIUM MAGNESIUM ZINC PO Take 1 tablet by mouth daily.     cholecalciferol (VITAMIN D3) 25 MCG (1000 UNIT) tablet Take 1,000 Units by mouth daily. 3-4 tablet daily     hydrochlorothiazide (HYDRODIURIL) 25 MG tablet Take 1 tablet (25 mg total) by mouth daily. 90 tablet 3   ibuprofen (ADVIL,MOTRIN) 200 MG tablet Take 200 mg by mouth every 6 (six) hours as needed for mild pain (prn for general aches and pains). (Patient not taking: Reported on 09/07/2022)     Current Facility-Administered Medications  Medication Dose Route Frequency Provider Last Rate Last Admin   0.9 %  sodium chloride infusion  500 mL Intravenous Once Sharyn Creamer, MD        Allergies as of 10/03/2022 - Review Complete 10/03/2022  Allergen Reaction Noted   Lisinopril  07/22/2020    Family History  Problem Relation Age of Onset   Arthritis Mother    Cancer Mother    Hyperlipidemia Mother    Arthritis Father    Cancer Father    Hypertension Father    Hyperlipidemia Father    Heart attack Brother    Birth defects Brother    Diabetes Brother    Hyperlipidemia Brother    Colon cancer Neg Hx    Colon polyps Neg Hx    Esophageal cancer Neg Hx    Rectal cancer Neg Hx    Stomach cancer Neg Hx     Social History   Socioeconomic History   Marital status: Married     Spouse name: Not on file   Number of children: Not on file   Years of education: Not on file   Highest education level: Not on file  Occupational History   Not on file  Tobacco Use   Smoking status: Former    Types: Cigarettes   Smokeless tobacco: Never  Vaping Use   Vaping Use: Never used  Substance and Sexual Activity   Alcohol use: Yes   Drug use: Never   Sexual activity: Yes  Other Topics Concern   Not on file  Social History Narrative   Not on file   Social Determinants of Health   Financial Resource Strain: Not on file  Food Insecurity: Not on file  Transportation Needs: Not on file  Physical Activity: Not on file  Stress: Not on file  Social Connections: Not on file  Intimate Partner Violence: Not on file    Physical Exam: Vital signs in last 24 hours: BP (!) 138/99   Pulse (!) 53   Temp (!) 97.3 F (36.3 C) (Skin)   Resp 10   Ht 5\' 4"  (1.626 m)   Wt 123 lb 6.4 oz (56 kg)   LMP  (LMP Unknown)   SpO2 98%   BMI 21.18 kg/m  GEN: NAD EYE: Sclerae anicteric ENT: MMM CV: Non-tachycardic Pulm: No increased work of breathing GI: Soft, NT/ND NEURO:  Alert & Oriented   Christia Reading, MD [facility]  10/03/2022 8:11 AM

## 2022-10-04 ENCOUNTER — Telehealth: Payer: Self-pay

## 2022-10-04 NOTE — Telephone Encounter (Signed)
  Follow up Call-     10/03/2022    7:28 AM  Call back number  Post procedure Call Back phone  # [phone]  Permission to leave phone message Yes     Patient questions:  Do you have a fever, pain , or abdominal swelling? No. Pain Score  0 *  Have you tolerated food without any problems? Yes.    Have you been able to return to your normal activities? Yes.    Do you have any questions about your discharge instructions: Diet   No. Medications  No. Follow up visit  No.  Do you have questions or concerns about your Care? No.  Actions: * If pain score is 4 or above: No action needed, pain <4.

## 2022-10-05 ENCOUNTER — Encounter: Payer: Self-pay | Admitting: Internal Medicine

## 2022-12-21 IMAGING — CT CT ANGIO CHEST-ABD-PELV FOR DISSECTION W/ AND WO/W CM
2 of 7 series · 13 of 46 positions shown, 15 images · non-contrast
Comparison: None.

CLINICAL DATA: Acute chest and back pain.

EXAM:
CT ANGIOGRAPHY CHEST, ABDOMEN AND PELVIS
TECHNIQUE: Non-contrast CT of the chest was initially obtained.

[Series 6: arterial · axial · arterial · 0.72mm/px · z∈[-589,-25]mm · 10 of 318 slices shown, 12 images]
[im 18/318  soft-tissue]
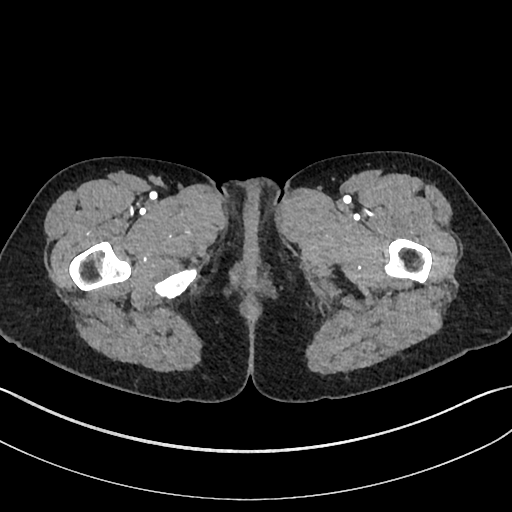
[im 18/318  bone]
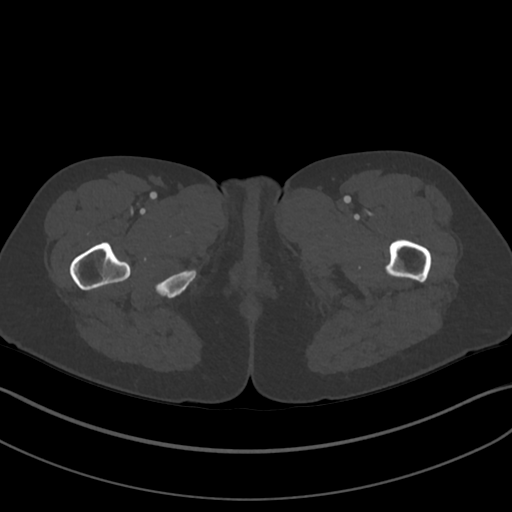
[im 53/318  soft-tissue]
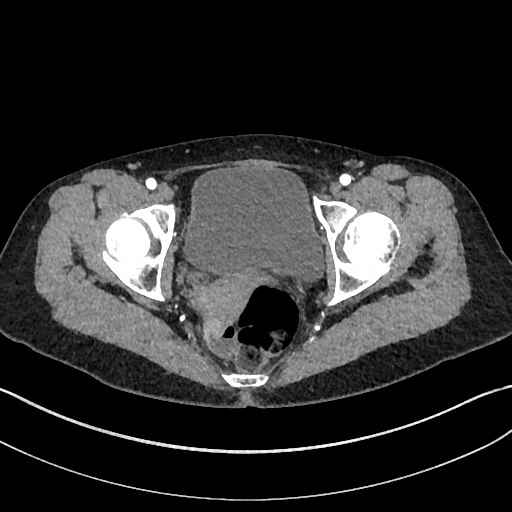
[im 89/318  soft-tissue]
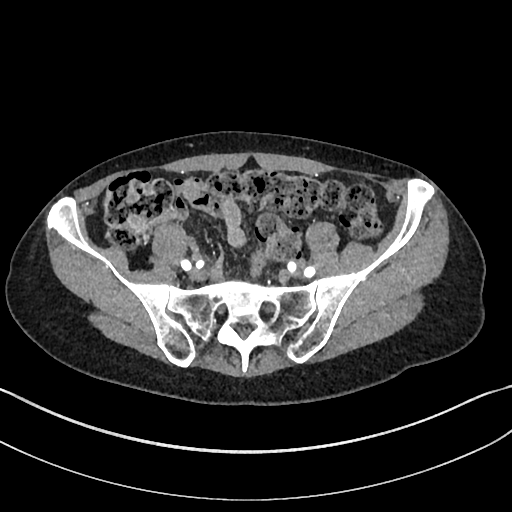
[im 106/318  soft-tissue]
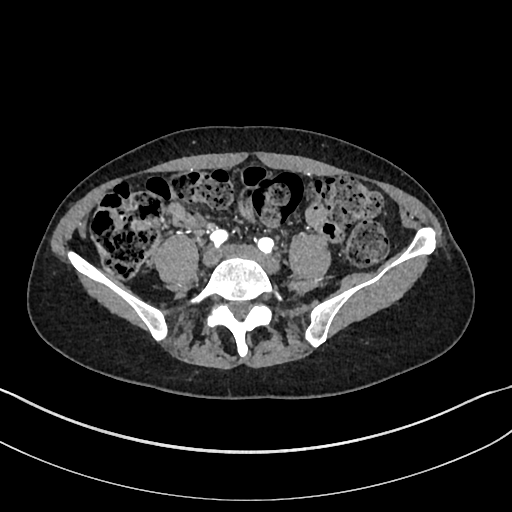
[im 141/318  soft-tissue]
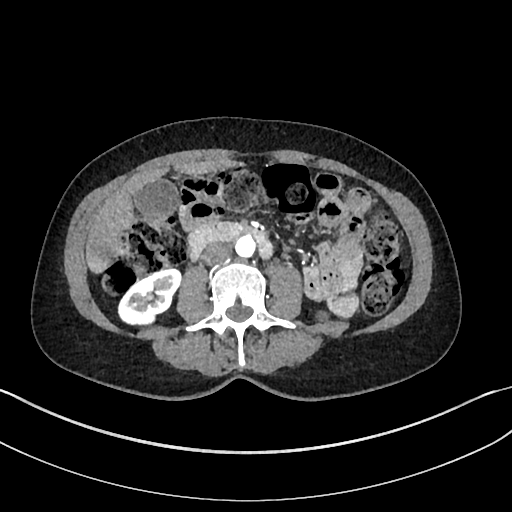
[im 177/318  soft-tissue]
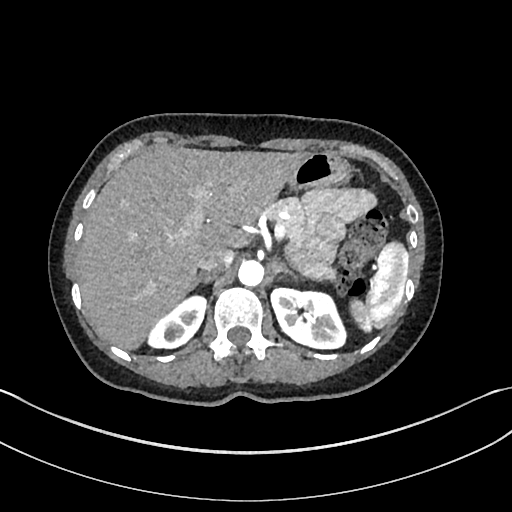
[im 212/318  soft-tissue]
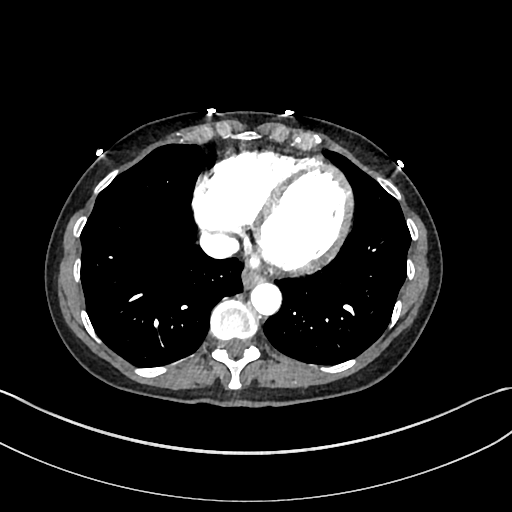
[im 229/318  soft-tissue]
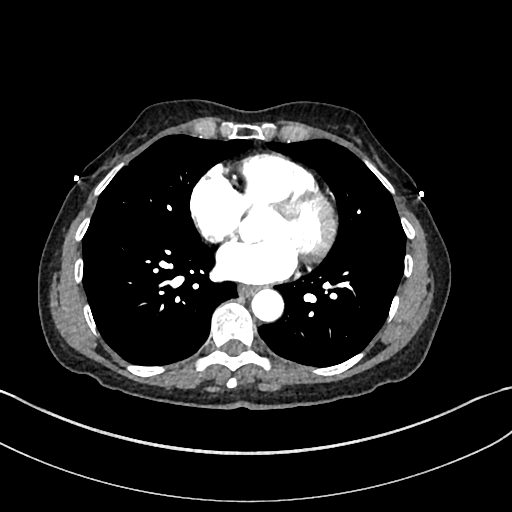
[im 265/318  soft-tissue]
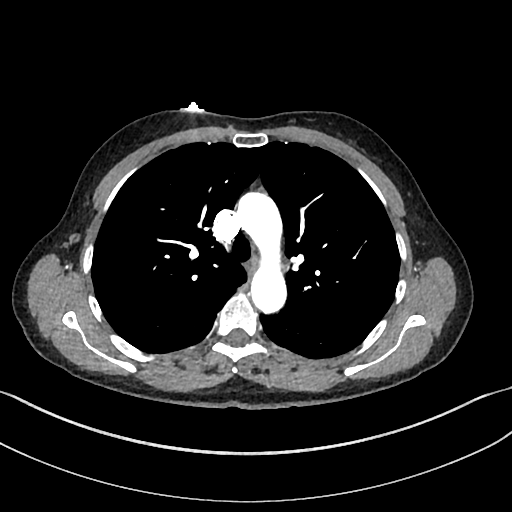
[im 265/318  bone]
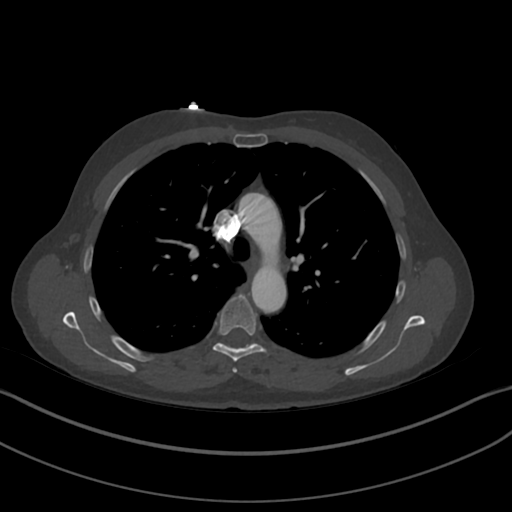
[im 300/318  soft-tissue]
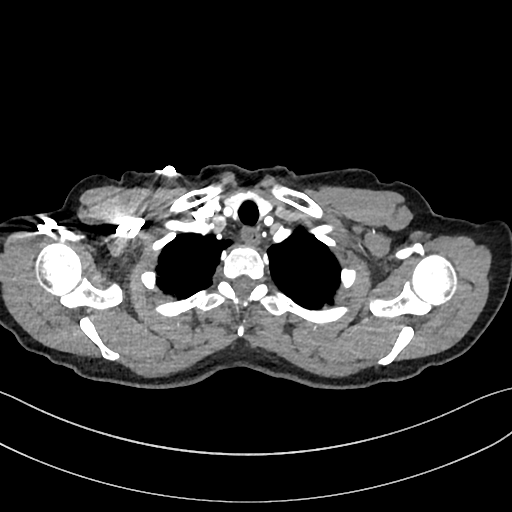

[Series 9: cor · coronal · 0.61mm/px · 3 of 113 slices shown]
[im 29/113  soft-tissue]
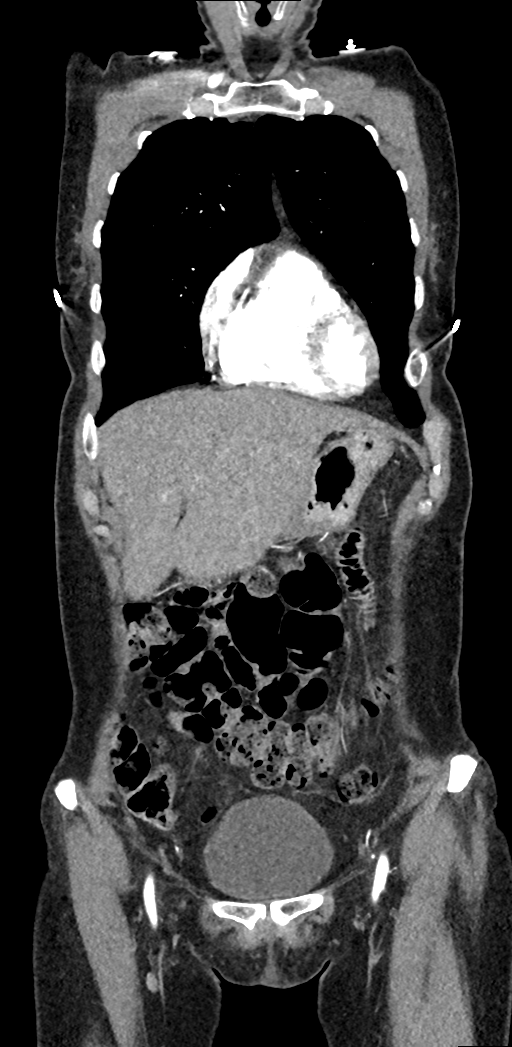
[im 57/113  soft-tissue]
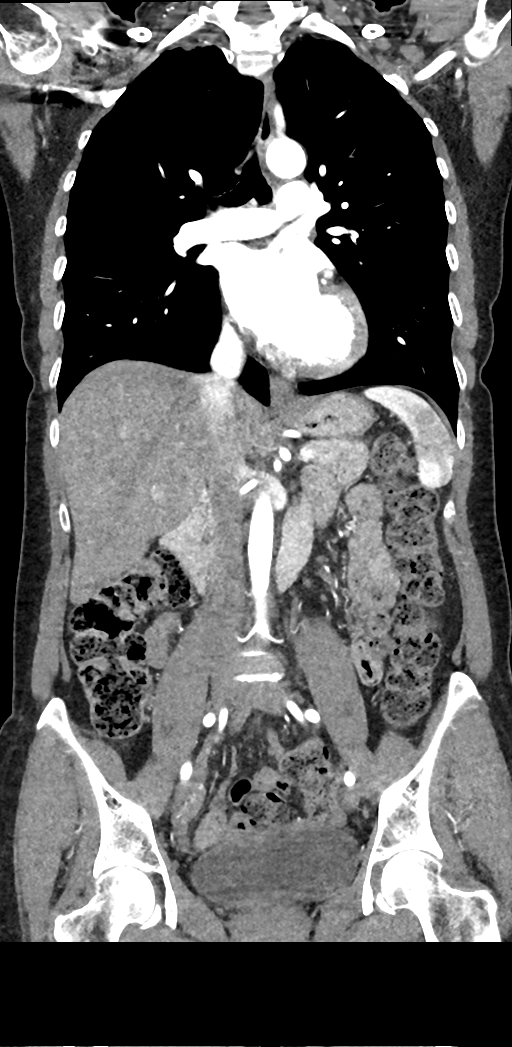
[im 85/113  soft-tissue]
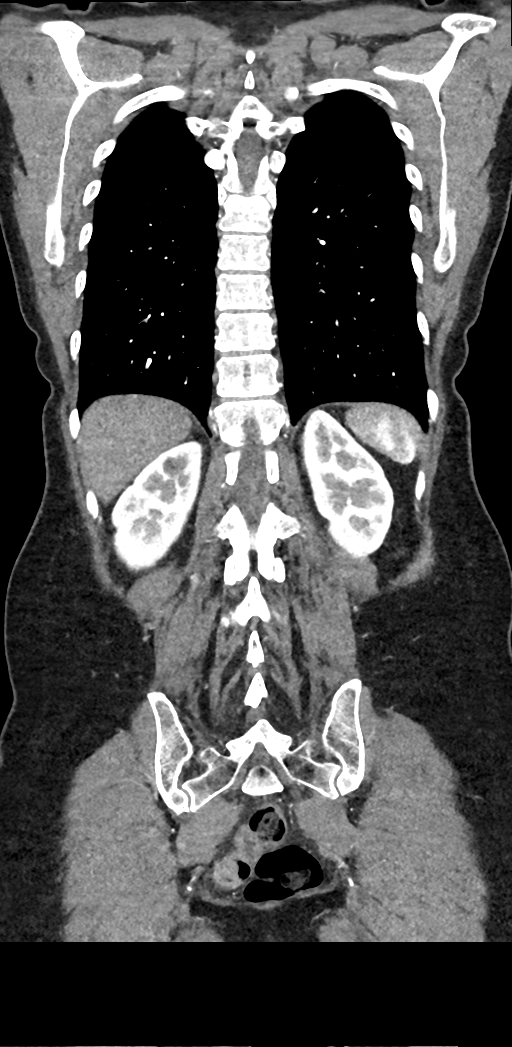

[13 of 46 positions shown; findings below may reference images not displayed]

Multidetector CT imaging through the chest, abdomen and pelvis was
performed using the standard protocol during bolus administration of
intravenous contrast. Multiplanar reconstructed images and MIPs were
obtained and reviewed to evaluate the vascular anatomy.

RADIATION DOSE REDUCTION: This exam was performed according to the
departmental dose-optimization program which includes automated
exposure control, adjustment of the mA and/or kV according to
patient size and/or use of iterative reconstruction technique.

CONTRAST:  100mL OMNIPAQUE IOHEXOL 350 MG/ML SOLN
FINDINGS: CTA CHEST FINDINGS

Cardiovascular: Preferential opacification of the thoracic aorta. No
evidence of thoracic aortic aneurysm or dissection. Normal heart
size. No pericardial effusion.

Mediastinum/Nodes: No enlarged mediastinal, hilar, or axillary lymph
nodes. Thyroid gland, trachea, and esophagus demonstrate no
significant findings.

Lungs/Pleura: Lungs are clear. No pleural effusion or pneumothorax.

Musculoskeletal: No chest wall abnormality. No acute or significant
osseous findings.

Review of the MIP images confirms the above findings.

CTA ABDOMEN AND PELVIS FINDINGS

VASCULAR

Aorta: Normal caliber aorta without aneurysm, dissection, vasculitis
or significant stenosis.

Celiac: Patent without evidence of aneurysm, dissection, vasculitis
or significant stenosis.

SMA: Patent without evidence of aneurysm, dissection, vasculitis or
significant stenosis.

Renals: Both renal arteries are patent without evidence of aneurysm,
dissection, vasculitis, fibromuscular dysplasia or significant
stenosis.

IMA: Patent without evidence of aneurysm, dissection, vasculitis or
significant stenosis.

Inflow: Patent without evidence of aneurysm, dissection, vasculitis
or significant stenosis.

Veins: No obvious venous abnormality within the limitations of this
arterial phase study.

Review of the MIP images confirms the above findings.

NON-VASCULAR

Hepatobiliary: No focal liver abnormality is seen. No gallstones,
gallbladder wall thickening, or biliary dilatation.

Pancreas: Unremarkable. No pancreatic ductal dilatation or
surrounding inflammatory changes.

Spleen: Normal in size without focal abnormality.

Adrenals/Urinary Tract: Adrenal glands are unremarkable. Kidneys are
normal, without renal calculi, focal lesion, or hydronephrosis.
Bladder is unremarkable.

Stomach/Bowel: Stomach is within normal limits. Appendix appears
normal. No evidence of bowel wall thickening, distention, or
inflammatory changes.

Lymphatic: No adenopathy is noted.

Reproductive: Uterus and bilateral adnexa are unremarkable.

Other: No abdominal wall hernia or abnormality. No abdominopelvic
ascites.

Musculoskeletal: No acute or significant osseous findings.

Review of the MIP images confirms the above findings.
IMPRESSION: No definite evidence of thoracic or abdominal aortic dissection or
aneurysm. No definite evidence of mesenteric or renal artery
stenosis.

No definite abnormality seen in the chest, abdomen or pelvis.

## 2023-03-23 ENCOUNTER — Encounter: Payer: Self-pay | Admitting: Internal Medicine

## 2023-04-12 ENCOUNTER — Encounter: Payer: Commercial Managed Care - PPO | Admitting: Internal Medicine

## 2023-04-30 ENCOUNTER — Encounter: Payer: Commercial Managed Care - PPO | Admitting: Internal Medicine

## 2023-05-07 ENCOUNTER — Ambulatory Visit (AMBULATORY_SURGERY_CENTER): Payer: Commercial Managed Care - PPO

## 2023-05-07 VITALS — Ht 64.0 in | Wt 120.0 lb

## 2023-05-07 DIAGNOSIS — Z860100 Personal history of colon polyps, unspecified: Secondary | ICD-10-CM

## 2023-05-07 MED ORDER — NA SULFATE-K SULFATE-MG SULF 17.5-3.13-1.6 GM/177ML PO SOLN: 1.0000 | Freq: Once | ORAL | 0 refills | Status: AC

## 2023-05-07 NOTE — Progress Notes (Signed)
Pre visit completed via phone call; Patient verified name, DOB, and address; No egg or soy allergy known to patient;  No issues known to pt with past sedation with any surgeries or procedures; Patient denies ever being told they had issues or difficulty with intubation;  No FH of Malignant Hyperthermia; Pt is not on diet pills; Pt is not on home 02;  Pt is not on blood thinners;  Pt denies issues with constipation;  No A fib or A flutter; Have any cardiac testing pending--NO Insurance verified during PV appt--- U[employer] Pt can ambulate without assistance;  Pt denies use of chewing tobacco; Discussed diabetic/weight loss medication holds; Discussed NSAID holds; Checked BMI to be less than 50; Pt instructed to use [employer] or GoodRx for a price reduction on prep;  Patient's chart reviewed by Cathlyn Parsons CNRA prior to previsit and patient appropriate for the [facility].  Pre visit completed and red dot placed by patient's name on their procedure day (on provider's schedule).    Instructions sent to MyChart per patient request;

## 2023-05-22 ENCOUNTER — Encounter: Payer: Self-pay | Admitting: Internal Medicine

## 2023-06-01 ENCOUNTER — Ambulatory Visit: Payer: Commercial Managed Care - PPO | Admitting: Internal Medicine

## 2023-06-01 ENCOUNTER — Encounter: Payer: Self-pay | Admitting: Internal Medicine

## 2023-06-01 VITALS — BP 114/64 | HR 55 | Temp 97.3°F | Resp 16 | Ht 64.0 in | Wt 120.0 lb

## 2023-06-01 DIAGNOSIS — Z860100 Personal history of colon polyps, unspecified: Secondary | ICD-10-CM

## 2023-06-01 DIAGNOSIS — D122 Benign neoplasm of ascending colon: Secondary | ICD-10-CM | POA: Diagnosis not present

## 2023-06-01 DIAGNOSIS — Z1211 Encounter for screening for malignant neoplasm of colon: Secondary | ICD-10-CM

## 2023-06-01 DIAGNOSIS — D120 Benign neoplasm of cecum: Secondary | ICD-10-CM

## 2023-06-01 MED ORDER — SODIUM CHLORIDE 0.9 % IV SOLN: 500.0000 mL | Freq: Once | INTRAVENOUS | Status: DC

## 2023-06-01 NOTE — Progress Notes (Signed)
GASTROENTEROLOGY PROCEDURE H&P NOTE   Primary Care Physician: Allwardt, Crist Infante, PA-C    Reason for Procedure:   History of colon polyps  Plan:    Colonoscopy  Patient is appropriate for endoscopic procedure(s) in the ambulatory ([facility]) setting.  The nature of the procedure, as well as the risks, benefits, and alternatives were carefully and thoroughly reviewed with the patient. Ample time for discussion and questions allowed. The patient understood, was satisfied, and agreed to proceed.     HPI: Marissa Gross is a 62 y.o. female who presents for colonoscopy for history of colon polyps. Denies blood in stools, changes in bowel habits, or unintentional weight loss. Denies family history of colon cancer.  Colonoscopy 10/03/22: - One 25 mm polyp in the ascending colon, removed with mucosal resection. Resected and retrieved. Tattooed. - Mucosal resection was performed. Resection and retrieval were complete. - Non-bleeding internal hemorrhoids Path: Surgical [P], colon, ascending, polyp (1) TUBULOVILLOUS ADENOMA.  Past Medical History:  Diagnosis Date   Allergy    Colon polyps    Elevated cholesterol    GERD (gastroesophageal reflux disease)    History of chicken pox    Hypertension    Osteoporosis     Past Surgical History:  Procedure Laterality Date   COLONOSCOPY  01/27/2019   Dr Loreta Ave, TA   COLONOSCOPY WITH PROPOFOL  10/03/2022   YD-MAC-suprep(good)-TA-14month recall   MOUTH SURGERY  2019   POLYPECTOMY  09/2022   TA   TONSILLECTOMY      Prior to Admission medications   Medication Sig Start Date End Date Taking? Authorizing Provider  CALCIUM MAGNESIUM ZINC PO Take 1 tablet by mouth daily.   Yes [provider]  cholecalciferol (VITAMIN D3) 25 MCG (1000 UNIT) tablet Take 3,000 Units by mouth daily.   Yes [provider]  hydrochlorothiazide (HYDRODIURIL) 25 MG tablet Take 1 tablet (25 mg total) by mouth daily. 07/13/22  Yes Allwardt, Alyssa M, PA-C   ibuprofen (ADVIL,MOTRIN) 200 MG tablet Take 200 mg by mouth every 6 (six) hours as needed for mild pain (pain score 1-3) (prn for general aches and pains).    [provider]    Current Outpatient Medications  Medication Sig Dispense Refill   CALCIUM MAGNESIUM ZINC PO Take 1 tablet by mouth daily.     cholecalciferol (VITAMIN D3) 25 MCG (1000 UNIT) tablet Take 3,000 Units by mouth daily.     hydrochlorothiazide (HYDRODIURIL) 25 MG tablet Take 1 tablet (25 mg total) by mouth daily. 90 tablet 3   ibuprofen (ADVIL,MOTRIN) 200 MG tablet Take 200 mg by mouth every 6 (six) hours as needed for mild pain (pain score 1-3) (prn for general aches and pains).     Current Facility-Administered Medications  Medication Dose Route Frequency Provider Last Rate Last Admin   0.9 %  sodium chloride infusion  500 mL Intravenous Once Imogene Burn, MD        Allergies as of 06/01/2023 - Review Complete 06/01/2023  Allergen Reaction Noted   Lisinopril  07/22/2020    Family History  Problem Relation Age of Onset   Arthritis Mother    Cancer Mother    Hyperlipidemia Mother    Arthritis Father    Cancer Father    Hypertension Father    Hyperlipidemia Father    Heart attack Brother    Birth defects Brother    Diabetes Brother    Hyperlipidemia Brother    Colon cancer Neg Hx    Colon  polyps Neg Hx    Esophageal cancer Neg Hx    Rectal cancer Neg Hx    Stomach cancer Neg Hx     Social History   Socioeconomic History   Marital status: Married    Spouse name: Not on file   Number of children: Not on file   Years of education: Not on file   Highest education level: Not on file  Occupational History   Not on file  Tobacco Use   Smoking status: Former    Types: Cigarettes   Smokeless tobacco: Never  Vaping Use   Vaping status: Never Used  Substance and Sexual Activity   Alcohol use: Yes    Alcohol/week: 4.0 standard drinks of alcohol    Types: 4 Glasses of wine per week   Drug  use: Never   Sexual activity: Yes    Birth control/protection: Post-menopausal  Other Topics Concern   Not on file  Social History Narrative   Not on file   Social Determinants of Health   Financial Resource Strain: Not on file  Food Insecurity: Not on file  Transportation Needs: Not on file  Physical Activity: Not on file  Stress: Not on file  Social Connections: Not on file  Intimate Partner Violence: Not on file    Physical Exam: Vital signs in last 24 hours: BP 122/72   Pulse 60   Temp (!) 97.3 F (36.3 C) (Esophageal)   Ht 5\' 4"  (1.626 m)   Wt 120 lb (54.4 kg)   LMP  (LMP Unknown)   SpO2 100%   BMI 20.60 kg/m  GEN: NAD EYE: Sclerae anicteric ENT: MMM CV: Non-tachycardic Pulm: No increased work of breathing GI: Soft, NT/ND NEURO:  Alert & Oriented   Eulah Pont, MD [facility]  06/01/2023 8:53 AM

## 2023-06-01 NOTE — Patient Instructions (Signed)
Handout provided about hemorrhoids and polyps.  Await pathology results.   YOU HAD AN ENDOSCOPIC PROCEDURE TODAY AT THE [facility]:   Refer to the procedure report that was given to you for any specific questions about what was found during the examination.  If the procedure report does not answer your questions, please call your gastroenterologist to clarify.  If you requested that your care partner not be given the details of your procedure findings, then the procedure report has been included in a sealed envelope for you to review at your convenience later.  YOU SHOULD EXPECT: Some feelings of bloating in the abdomen. Passage of more gas than usual.  Walking can help get rid of the air that was put into your GI tract during the procedure and reduce the bloating. If you had a lower endoscopy (such as a colonoscopy or flexible sigmoidoscopy) you may notice spotting of blood in your stool or on the toilet paper. If you underwent a bowel prep for your procedure, you may not have a normal bowel movement for a few days.  Please Note:  You might notice some irritation and congestion in your nose or some drainage.  This is from the oxygen used during your procedure.  There is no need for concern and it should clear up in a day or so.  SYMPTOMS TO REPORT IMMEDIATELY:  Following lower endoscopy (colonoscopy or flexible sigmoidoscopy):  Excessive amounts of blood in the stool  Significant tenderness or worsening of abdominal pains  Swelling of the abdomen that is new, acute  Fever of 100F or higher  For urgent or emergent issues, a gastroenterologist can be reached at any hour by calling [phone]. Do not use MyChart messaging for urgent concerns.    DIET:  We do recommend a small meal at first, but then you may proceed to your regular diet.  Drink plenty of fluids but you should avoid alcoholic beverages for 24 hours.  ACTIVITY:  You should plan to take it easy for the rest of  today and you should NOT DRIVE or use heavy machinery until tomorrow (because of the sedation medicines used during the test).    FOLLOW UP: Our staff will call the number listed on your records the next business day following your procedure.  We will call around 7:15- 8:00 am to check on you and address any questions or concerns that you may have regarding the information given to you following your procedure. If we do not reach you, we will leave a message.     If any biopsies were taken you will be contacted by phone or by letter within the next 1-3 weeks.  Please call us at [phone] if you have not heard about the biopsies in 3 weeks.    SIGNATURES/CONFIDENTIALITY: You and/or your care partner have signed paperwork which will be entered into your electronic medical record.  These signatures attest to the fact that that the information above on your After Visit Summary has been reviewed and is understood.  Full responsibility of the confidentiality of this discharge information lies with you and/or your care-partner.

## 2023-06-01 NOTE — Progress Notes (Signed)
Pt's states no medical or surgical changes since previsit or office visit. 

## 2023-06-01 NOTE — Progress Notes (Signed)
Sedate, gd SR, tolerated procedure well, VSS, report to RN 

## 2023-06-01 NOTE — Progress Notes (Signed)
Called to room to assist during endoscopic procedure.  Patient ID and intended procedure confirmed with present staff. Received instructions for my participation in the procedure from the performing physician.  

## 2023-06-01 NOTE — Op Note (Signed)
[facility] Patient Name: Marissa Gross Procedure Date: 06/01/2023 9:23 AM MRN: [id number] Endoscopist: Madelyn Brunner [place] , , [id number] Age: 62 Referring MD:  Date of Birth: 1961/05/31 Gender: Female Account #: [id number] Procedure:                Colonoscopy Indications:              High risk colon cancer surveillance: Personal                            history of colonic polyps Medicines:                Monitored Anesthesia Care Procedure:                Pre-Anesthesia Assessment:                           - Prior to the procedure, a History and Physical                            was performed, and patient medications and                            allergies were reviewed. The patient's tolerance of                            previous anesthesia was also reviewed. The risks                            and benefits of the procedure and the sedation                            options and risks were discussed with the patient.                            All questions were answered, and informed consent                            was obtained. Prior Anticoagulants: The patient has                            taken no anticoagulant or antiplatelet agents. ASA                            Grade Assessment: II - A patient with mild systemic                            disease. After reviewing the risks and benefits,                            the patient was deemed in satisfactory condition to                            undergo the procedure.  After obtaining informed consent, the colonoscope                            was passed under direct vision. Throughout the                            procedure, the patient's blood pressure, pulse, and                            oxygen saturations were monitored continuously. The                            Olympus Scope [id number] was introduced through the                            anus and advanced to the the  terminal ileum. The                            colonoscopy was performed without difficulty. The                            patient tolerated the procedure well. The quality                            of the bowel preparation was good. The terminal                            ileum, ileocecal valve, appendiceal orifice, and                            rectum were photographed. Scope In: 9:27:57 AM Scope Out: 10:02:09 AM Scope Withdrawal Time: 0 hours 25 minutes 25 seconds  Total Procedure Duration: 0 hours 34 minutes 12 seconds  Findings:                 The terminal ileum appeared normal.                           A 3 mm polyp was found in the cecum. The polyp was                            sessile. The polyp was removed with a cold snare.                            Resection and retrieval were complete.                           A 10 mm polyp was found in the ascending colon. The                            polyp was sessile. The polyp was removed with a                            cold  biopsy forceps. The polyp was removed with a                            cold snare. Resection and retrieval were complete.                           A tattoo was seen in the ascending colon.                           Non-bleeding internal hemorrhoids were found during                            retroflexion. Complications:            No immediate complications. Estimated Blood Loss:     Estimated blood loss was minimal. Impression:               - The examined portion of the ileum was normal.                           - One 3 mm polyp in the cecum, removed with a cold                            snare. Resected and retrieved.                           - One 10 mm polyp in the ascending colon, removed                            with a cold biopsy forceps and removed with a cold                            snare. Resected and retrieved.                           - A tattoo was seen in the ascending colon.                            - Non-bleeding internal hemorrhoids. Recommendation:           - Discharge patient to home (with escort).                           - Await pathology results.                           - Repeat colonoscopy in 1 year for surveillance.                           - The findings and recommendations were discussed                            with the patient. Dr Particia Lather "Alan Ripper" Leonides Schanz,  06/01/2023 10:10:47 AM

## 2023-06-04 ENCOUNTER — Telehealth: Payer: Self-pay

## 2023-06-04 NOTE — Telephone Encounter (Signed)
  Follow up Call-     06/01/2023    8:33 AM 10/03/2022    7:28 AM  Call back number  Post procedure Call Back phone  # [phone]  Permission to leave phone message Yes Yes    Post op call attempted, no answer, left WM.

## 2023-06-06 ENCOUNTER — Encounter: Payer: Self-pay | Admitting: Internal Medicine

## 2023-06-06 LAB — SURGICAL PATHOLOGY

## 2023-06-13 ENCOUNTER — Other Ambulatory Visit: Payer: Self-pay | Admitting: Physician Assistant

## 2023-06-13 ENCOUNTER — Telehealth: Payer: Self-pay | Admitting: Physician Assistant

## 2023-06-13 ENCOUNTER — Other Ambulatory Visit: Payer: Self-pay

## 2023-06-13 MED ORDER — HYDROCHLOROTHIAZIDE 25 MG PO TABS: 25.0000 mg | ORAL_TABLET | Freq: Every day | ORAL | 0 refills | Status: DC

## 2023-06-13 NOTE — Telephone Encounter (Signed)
30 day supply of medication sent to pt pharmacy to get pt through to appt scheduled

## 2023-06-13 NOTE — Telephone Encounter (Signed)
Patient scheduled for pcp's next cpe appt on Jan 29th but will run out of medication hydrochlorothiazide (HYDRODIURIL) 25 MG tablet around 01/04 . Please advise

## 2023-07-16 ENCOUNTER — Encounter: Payer: Commercial Managed Care - PPO | Admitting: Physician Assistant

## 2023-07-24 LAB — HM MAMMOGRAPHY

## 2023-08-08 ENCOUNTER — Encounter: Payer: Commercial Managed Care - PPO | Admitting: Physician Assistant

## 2023-08-22 ENCOUNTER — Other Ambulatory Visit: Payer: Self-pay | Admitting: Physician Assistant

## 2023-08-28 LAB — HM DEXA SCAN

## 2023-09-06 ENCOUNTER — Ambulatory Visit (INDEPENDENT_AMBULATORY_CARE_PROVIDER_SITE_OTHER): Payer: Commercial Managed Care - PPO | Admitting: Physician Assistant

## 2023-09-06 ENCOUNTER — Encounter: Payer: Self-pay | Admitting: Physician Assistant

## 2023-09-06 VITALS — BP 114/64 | HR 62 | Temp 98.0°F | Ht 64.0 in | Wt 123.0 lb

## 2023-09-06 DIAGNOSIS — R7989 Other specified abnormal findings of blood chemistry: Secondary | ICD-10-CM

## 2023-09-06 DIAGNOSIS — E782 Mixed hyperlipidemia: Secondary | ICD-10-CM | POA: Diagnosis not present

## 2023-09-06 DIAGNOSIS — L918 Other hypertrophic disorders of the skin: Secondary | ICD-10-CM

## 2023-09-06 DIAGNOSIS — Z0000 Encounter for general adult medical examination without abnormal findings: Secondary | ICD-10-CM | POA: Diagnosis not present

## 2023-09-06 DIAGNOSIS — I10 Essential (primary) hypertension: Secondary | ICD-10-CM

## 2023-09-06 DIAGNOSIS — M810 Age-related osteoporosis without current pathological fracture: Secondary | ICD-10-CM

## 2023-09-06 DIAGNOSIS — Z1283 Encounter for screening for malignant neoplasm of skin: Secondary | ICD-10-CM

## 2023-09-06 LAB — COMPREHENSIVE METABOLIC PANEL
ALT: 13 U/L (ref 0–35)
AST: 18 U/L (ref 0–37)
Albumin: 4.3 g/dL (ref 3.5–5.2)
Alkaline Phosphatase: 61 U/L (ref 39–117)
BUN: 11 mg/dL (ref 6–23)
CO2: 30 meq/L (ref 19–32)
Calcium: 9.5 mg/dL (ref 8.4–10.5)
Chloride: 100 meq/L (ref 96–112)
Creatinine, Ser: 0.72 mg/dL (ref 0.40–1.20)
GFR: 89.33 mL/min (ref >60.00)
Glucose, Bld: 93 mg/dL (ref 70–99)
Potassium: 3.2 meq/L — ABNORMAL LOW (ref 3.5–5.1)
Sodium: 139 meq/L (ref 135–145)
Total Bilirubin: 0.6 mg/dL (ref 0.2–1.2)
Total Protein: 6.9 g/dL (ref 6.0–8.3)

## 2023-09-06 LAB — CBC WITH DIFFERENTIAL/PLATELET
Basophils Absolute: 0 10*3/uL (ref 0.0–0.1)
Basophils Relative: 0.9 % (ref 0.0–3.0)
Eosinophils Absolute: 0 10*3/uL (ref 0.0–0.7)
Eosinophils Relative: 0.6 % (ref 0.0–5.0)
HCT: 39.4 % (ref 36.0–46.0)
Hemoglobin: 13.3 g/dL (ref 12.0–15.0)
Lymphocytes Relative: 44 % (ref 12.0–46.0)
Lymphs Abs: 1.8 10*3/uL (ref 0.7–4.0)
MCHC: 33.8 g/dL (ref 30.0–36.0)
MCV: 89.5 fL (ref 78.0–100.0)
Monocytes Absolute: 0.3 10*3/uL (ref 0.1–1.0)
Monocytes Relative: 7.5 % (ref 3.0–12.0)
Neutro Abs: 2 10*3/uL (ref 1.4–7.7)
Neutrophils Relative %: 47 % (ref 43.0–77.0)
Platelets: 296 10*3/uL (ref 150.0–400.0)
RBC: 4.4 Mil/uL (ref 3.87–5.11)
RDW: 13.2 % (ref 11.5–15.5)
WBC: 4.2 10*3/uL (ref 4.0–10.5)

## 2023-09-06 LAB — LIPID PANEL
Cholesterol: 259 mg/dL — ABNORMAL HIGH (ref 0–200)
HDL: 75.9 mg/dL (ref >39.00)
LDL Cholesterol: 146 mg/dL — ABNORMAL HIGH (ref 0–99)
NonHDL: 182.61
Total CHOL/HDL Ratio: 3
Triglycerides: 183 mg/dL — ABNORMAL HIGH (ref 0.0–149.0)
VLDL: 36.6 mg/dL (ref 0.0–40.0)

## 2023-09-06 LAB — VITAMIN D 25 HYDROXY (VIT D DEFICIENCY, FRACTURES): VITD: 22.67 ng/mL — ABNORMAL LOW (ref 30.00–100.00)

## 2023-09-06 LAB — TSH: TSH: 1.1 u[IU]/mL (ref 0.35–5.50)

## 2023-09-06 LAB — HEMOGLOBIN A1C: Hgb A1c MFr Bld: 5.7 % (ref 4.6–6.5)

## 2023-09-06 MED ORDER — HYDROCHLOROTHIAZIDE 25 MG PO TABS: 25.0000 mg | ORAL_TABLET | Freq: Every day | ORAL | 3 refills | Status: AC

## 2023-09-06 NOTE — Progress Notes (Signed)
 Patient ID: VESTAL CRANDALL, female    DOB: Mar 19, 1961, 63 y.o.   MRN: [id number]   Assessment & Plan:  Annual physical exam -     Lipid panel -     Comprehensive metabolic panel -     CBC with Differential/Platelet -     Hemoglobin A1c -     TSH  Low vitamin D level -     VITAMIN D 25 Hydroxy (Vit-D Deficiency, Fractures)  Essential hypertension -     hydroCHLOROthiazide; Take 1 tablet (25 mg total) by mouth daily.  Dispense: 90 tablet; Refill: 3  Mixed hyperlipidemia -     Lipid panel  Osteoporosis of femur without pathological fracture  Skin tags, multiple acquired -     Ambulatory referral to Dermatology  Skin cancer screening -     Ambulatory referral to Dermatology   Assessment and Plan    Hypertension Well controlled on Hydrochlorothiazide 25mg  daily. No reported side effects. -Refill Hydrochlorothiazide 25mg  daily.  Vitamin D Deficiency History of low levels, inconsistent supplement use. -Check Vitamin D levels today. -Encouraged consistent use of Vitamin D supplements.  Osteoporosis Worsening bone density scan results. Inconsistent use of Vitamin D and Calcium supplements. -Check Calcium levels today. -Encouraged consistent use of Vitamin D and Calcium supplements. -F/up with GYN who ordered  Skin Tags Multiple skin tags under arm causing discomfort. -Consider cryotherapy treatment for skin tags in office.  General Health Maintenance -Referral to dermatology for annual skin checks. -Check cholesterol levels today.      Age-appropriate screening and counseling performed today. Will check labs and call with results. Preventive measures discussed and printed in AVS for patient.   Patient Counseling: [x]   Nutrition: Stressed importance of moderation in sodium/caffeine intake, saturated fat and cholesterol, caloric balance, sufficient intake of fresh fruits, vegetables, and fiber.  [x]   Stressed the importance of regular exercise.   []   Substance  Abuse: Discussed cessation/primary prevention of tobacco, alcohol, or other drug use; driving or other dangerous activities under the influence; availability of treatment for abuse.   [x]   Injury prevention: Discussed safety belts, safety helmets, smoke detector, smoking near bedding or upholstery.   []   Sexuality: Discussed sexually transmitted diseases, partner selection, use of condoms, avoidance of unintended pregnancy  and contraceptive alternatives.   [x]   Dental health: Discussed importance of regular tooth brushing, flossing, and dental visits.  [x]   Health maintenance and immunizations reviewed. Please refer to Health maintenance section.         Return in about 1 year (around 09/05/2024) for physical.    Subjective:    Chief Complaint  Patient presents with   Annual Exam    HPI Discussed the use of AI scribe software for clinical note transcription with the patient, who gave verbal consent to proceed.  History of Present Illness   ILZE ROSELLI is a 63 year old female who presents for a routine physical exam and blood pressure medication refill.  She takes hydrochlorothiazide 25 mg once daily for hypertension and reports no issues with this medication.  A recent bone scan showed worsening results compared to her previous scan. She has a history of osteoporosis, first noted three years ago, and has not been consistent with her vitamin D and calcium supplements due to being busy and forgetting. She has not yet discussed treatment options for osteoporosis with her healthcare provider.  She has a history of high cholesterol, which has been borderline in the past. She manages  it through diet and exercise, acknowledging a genetic predisposition to high cholesterol.  She has skin tags under her arm, particularly one that is bothersome due to its location near her bra strap. She does not have a dermatologist and is seeking a referral for annual skin checks. No family history of  melanoma or basal cell carcinoma.       Past Medical History:  Diagnosis Date   Allergy    Colon polyps    Elevated cholesterol    GERD (gastroesophageal reflux disease)    History of chicken pox    Hypertension    Osteoporosis     Past Surgical History:  Procedure Laterality Date   COLONOSCOPY  01/27/2019   Dr Loreta Ave, [place]   COLONOSCOPY WITH PROPOFOL  10/03/2022   YD-MAC-suprep(good)-TA-80month recall   MOUTH SURGERY  2019   POLYPECTOMY  09/2022   TA   TONSILLECTOMY      Family History  Problem Relation Age of Onset   Arthritis Mother    Cancer Mother    Hyperlipidemia Mother    Arthritis Father    Cancer Father    Hypertension Father    Hyperlipidemia Father    Heart attack Brother    Birth defects Brother    Diabetes Brother    Hyperlipidemia Brother    Colon cancer Neg Hx    Colon polyps Neg Hx    Esophageal cancer Neg Hx    Rectal cancer Neg Hx    Stomach cancer Neg Hx     Social History   Tobacco Use   Smoking status: Former    Types: Cigarettes   Smokeless tobacco: Never  Vaping Use   Vaping status: Never Used  Substance Use Topics   Alcohol use: Yes    Alcohol/week: 4.0 standard drinks of alcohol    Types: 4 Glasses of wine per week   Drug use: Never     Allergies  Allergen Reactions   Lisinopril     Review of Systems NEGATIVE UNLESS OTHERWISE INDICATED IN HPI      Objective:     BP 114/64   Pulse 62   Temp 98 F (36.7 C) (Temporal)   Ht 5\' 4"  (1.626 m)   Wt 123 lb (55.8 kg)   LMP  (LMP Unknown)   SpO2 99%   BMI 21.11 kg/m   Wt Readings from Last 3 Encounters:  09/06/23 123 lb (55.8 kg)  06/01/23 120 lb (54.4 kg)  05/07/23 120 lb (54.4 kg)    BP Readings from Last 3 Encounters:  09/06/23 114/64  06/01/23 114/64  10/03/22 (!) 100/59     Physical Exam Vitals and nursing note reviewed.  Constitutional:      Appearance: Normal appearance. She is normal weight. She is not toxic-appearing.  HENT:     Head:  Normocephalic and atraumatic.     Right Ear: Tympanic membrane, ear canal and external ear normal.     Left Ear: Tympanic membrane, ear canal and external ear normal.     Nose: Nose normal.     Mouth/Throat:     Mouth: Mucous membranes are moist.  Eyes:     Extraocular Movements: Extraocular movements intact.     Conjunctiva/sclera: Conjunctivae normal.     Pupils: Pupils are equal, round, and reactive to light.  Cardiovascular:     Rate and Rhythm: Normal rate and regular rhythm.     Pulses: Normal pulses.     Heart sounds: Normal heart sounds.  Pulmonary:  Effort: Pulmonary effort is normal.     Breath sounds: Normal breath sounds.  Abdominal:     General: Abdomen is flat. Bowel sounds are normal.     Palpations: Abdomen is soft.  Musculoskeletal:        General: Normal range of motion.     Cervical back: Normal range of motion and neck supple.     Right lower leg: No edema.     Left lower leg: No edema.  Skin:    General: Skin is warm and dry.     Comments: Benign skin tags left axilla / lateral chest   Neurological:     General: No focal deficit present.     Mental Status: She is alert and oriented to person, place, and time.  Psychiatric:        Mood and Affect: Mood normal.        Behavior: Behavior normal.        Magie Ciampa M Miliano Cotten, PA-C

## 2023-09-07 ENCOUNTER — Encounter: Payer: Self-pay | Admitting: Physician Assistant

## 2023-09-07 ENCOUNTER — Other Ambulatory Visit: Payer: Self-pay | Admitting: Physician Assistant

## 2023-09-07 MED ORDER — VITAMIN D (ERGOCALCIFEROL) 1.25 MG (50000 UNIT) PO CAPS: 50000.0000 [IU] | ORAL_CAPSULE | ORAL | 0 refills | Status: AC

## 2023-09-07 MED ORDER — POTASSIUM CHLORIDE CRYS ER 10 MEQ PO TBCR: 10.0000 meq | EXTENDED_RELEASE_TABLET | Freq: Every day | ORAL | 2 refills | Status: AC

## 2023-09-08 ENCOUNTER — Other Ambulatory Visit: Payer: Self-pay | Admitting: Physician Assistant

## 2023-09-12 ENCOUNTER — Encounter: Payer: Commercial Managed Care - PPO | Admitting: Physician Assistant

## 2023-09-18 ENCOUNTER — Encounter: Payer: Commercial Managed Care - PPO | Admitting: Physician Assistant

## 2024-01-02 ENCOUNTER — Ambulatory Visit (INDEPENDENT_AMBULATORY_CARE_PROVIDER_SITE_OTHER): Admitting: Dermatology

## 2024-01-02 ENCOUNTER — Encounter: Payer: Self-pay | Admitting: Dermatology

## 2024-01-02 VITALS — BP 134/84

## 2024-01-02 DIAGNOSIS — L918 Other hypertrophic disorders of the skin: Secondary | ICD-10-CM

## 2024-01-02 NOTE — Progress Notes (Signed)
   New Patient Visit   Subjective  Marissa Gross is a 63 y.o. female who presents for the following: New Pt - Skin Tags   Patient has a skin tag at the L axilla that presented years ago but has increasing gotten bigger over the years that it is now irritating during routine care (shaving). The area is not bothersome otherwise. She ha been seen by PCP for this issue and was given a referral to dermatology. Reports Hx of Bx (benign). Denied family Hx of skin cancer.   The following portions of the chart were reviewed this encounter and updated as appropriate: medications, allergies, medical history  Review of Systems:  No other skin or systemic complaints except as noted in HPI or Assessment and Plan.  Objective  Well appearing patient in no apparent distress; mood and affect are within normal limits.   A focused examination was performed of the following areas: L axilla   Relevant exam findings are noted in the Assessment and Plan.      Left Axilla   Assessment & Plan   Acrochordons (Skin Tags) - Fleshy, skin-colored pedunculated papules - Benign appearing.  - Observe. - If desired, they can be removed with an in office procedure that is not covered by insurance. - Please call the clinic if you notice any new or changing lesions.  INFLAMED SKIN TAG Left Axilla Destruction of lesion - Left Axilla Complexity: simple   Destruction method: cryotherapy   Informed consent: discussed and consent obtained   Timeout:  patient name, date of birth, surgical site, and procedure verified Lesion destroyed using liquid nitrogen: Yes   Post-procedure details: wound care instructions given    No follow-ups on file.   Documentation: I have reviewed the above documentation for accuracy and completeness, and I agree with the above.   I, Shirron Maranda, CMA, am acting as scribe for [employer], DO.   Delon Lenis, DO

## 2024-01-02 NOTE — Patient Instructions (Addendum)

## 2024-06-12 ENCOUNTER — Encounter: Payer: Self-pay | Admitting: Dermatology

## 2024-06-12 ENCOUNTER — Ambulatory Visit (INDEPENDENT_AMBULATORY_CARE_PROVIDER_SITE_OTHER): Admitting: Dermatology

## 2024-06-12 VITALS — BP 125/60

## 2024-06-12 DIAGNOSIS — L821 Other seborrheic keratosis: Secondary | ICD-10-CM

## 2024-06-12 DIAGNOSIS — W908XXA Exposure to other nonionizing radiation, initial encounter: Secondary | ICD-10-CM

## 2024-06-12 DIAGNOSIS — Z1283 Encounter for screening for malignant neoplasm of skin: Secondary | ICD-10-CM | POA: Diagnosis not present

## 2024-06-12 DIAGNOSIS — L578 Other skin changes due to chronic exposure to nonionizing radiation: Secondary | ICD-10-CM

## 2024-06-12 DIAGNOSIS — D229 Melanocytic nevi, unspecified: Secondary | ICD-10-CM

## 2024-06-12 DIAGNOSIS — L814 Other melanin hyperpigmentation: Secondary | ICD-10-CM

## 2024-06-12 DIAGNOSIS — D1801 Hemangioma of skin and subcutaneous tissue: Secondary | ICD-10-CM

## 2024-06-12 NOTE — Progress Notes (Signed)
   Total Body Skin Exam (TBSE) Visit   Subjective  Marissa Gross is a 63 y.o. female ESTABLISHED PATIENT who presents for the following:  Total Body Skin Exam (TBSE)  Patient presents for first TBSE.  Patient does not have spots of concern to be evaluated. She does apply sunscreen and/or wears protective coverings. No Hx of Bx. No  family Hx of skin cancers.   The patient has spots, moles and lesions to be evaluated, some may be new or changing and the patient has concerns that these could be cancer.  The following portions of the chart were reviewed this encounter and updated as appropriate: medications, allergies, medical history  Review of Systems:  No other skin or systemic complaints except as noted in HPI or Assessment and Plan.  Objective  Well appearing patient in no apparent distress; mood and affect are within normal limits.  A full examination was performed including scalp, head, eyes, ears, nose, lips, neck, chest, axillae, abdomen, back, buttocks, bilateral upper extremities, bilateral lower extremities, hands, feet, fingers, toes, fingernails, and toenails. All findings within normal limits unless otherwise noted below.   Relevant physical exam findings are noted in the Assessment and Plan.    Assessment & Plan   LENTIGINES, SEBORRHEIC KERATOSES, HEMANGIOMAS - Benign normal skin lesions - Benign-appearing - Call for any changes  BENIGN MELANOCYTIC NEVI - Tan-brown and/or pink-flesh-colored symmetric macules and papules - Benign appearing on exam today - Observation - Call clinic for new or changing moles - Recommend daily use of broad spectrum spf 30+ sunscreen to sun-exposed areas.   MILD ACTINIC DAMAGE - Chronic condition, secondary to cumulative UV/sun exposure - diffuse scaly erythematous macules with underlying dyspigmentation - Recommend daily broad spectrum sunscreen SPF 30+ to sun-exposed areas, reapply every 2 hours as needed.  - Staying in the  shade or wearing long sleeves, sun glasses (UVA+UVB protection) and wide brim hats (4-inch brim around the entire circumference of the hat) are also recommended for sun protection.  - Call for new or changing lesions.   SKIN CANCER SCREENING PERFORMED TODAY.   No follow-ups on file.   Documentation: I have reviewed the above documentation for accuracy and completeness, and I agree with the above.  I, Kingslee Dowse Maranda, CMA II, am acting as scribe for:  Delon Lenis, DO

## 2024-06-12 NOTE — Patient Instructions (Addendum)

## 2024-09-09 ENCOUNTER — Encounter: Payer: Commercial Managed Care - PPO | Admitting: Physician Assistant
